# Patient Record
Sex: Female | Born: 1960
Health system: Southern US, Community
[De-identification: ages and names within clinical notes are randomized; demographics above are authoritative.]

## PROBLEM LIST (undated history)

## (undated) DIAGNOSIS — F32A Depression, unspecified: Secondary | ICD-10-CM

## (undated) DIAGNOSIS — F209 Schizophrenia, unspecified: Secondary | ICD-10-CM

## (undated) DIAGNOSIS — F419 Anxiety disorder, unspecified: Secondary | ICD-10-CM

## (undated) DIAGNOSIS — K219 Gastro-esophageal reflux disease without esophagitis: Secondary | ICD-10-CM

## (undated) DIAGNOSIS — E7800 Pure hypercholesterolemia, unspecified: Secondary | ICD-10-CM

## (undated) DIAGNOSIS — E119 Type 2 diabetes mellitus without complications: Secondary | ICD-10-CM

## (undated) HISTORY — DX: Type 2 diabetes mellitus without complications: E11.9

## (undated) HISTORY — DX: Depression, unspecified: F32.A

## (undated) HISTORY — DX: Pure hypercholesterolemia, unspecified: E78.00

## (undated) HISTORY — DX: Anxiety disorder, unspecified: F41.9

## (undated) HISTORY — DX: Gastro-esophageal reflux disease without esophagitis: K21.9

## (undated) HISTORY — DX: Schizophrenia, unspecified: F20.9

---

## 2006-06-30 ENCOUNTER — Inpatient Hospital Stay (HOSPITAL_COMMUNITY): Admission: AD | Admit: 2006-06-30 | Discharge: 2006-07-06 | Payer: Self-pay | Admitting: *Deleted

## 2006-06-30 ENCOUNTER — Ambulatory Visit: Payer: Self-pay | Admitting: *Deleted

## 2009-08-25 ENCOUNTER — Inpatient Hospital Stay: Payer: Self-pay | Admitting: Psychiatry

## 2012-06-21 DIAGNOSIS — E785 Hyperlipidemia, unspecified: Secondary | ICD-10-CM | POA: Insufficient documentation

## 2016-02-15 DIAGNOSIS — Z8249 Family history of ischemic heart disease and other diseases of the circulatory system: Secondary | ICD-10-CM | POA: Insufficient documentation

## 2020-10-25 DIAGNOSIS — E538 Deficiency of other specified B group vitamins: Secondary | ICD-10-CM | POA: Diagnosis not present

## 2020-11-26 DIAGNOSIS — E538 Deficiency of other specified B group vitamins: Secondary | ICD-10-CM | POA: Diagnosis not present

## 2020-12-28 DIAGNOSIS — E538 Deficiency of other specified B group vitamins: Secondary | ICD-10-CM | POA: Diagnosis not present

## 2021-01-10 DIAGNOSIS — F251 Schizoaffective disorder, depressive type: Secondary | ICD-10-CM | POA: Diagnosis not present

## 2021-01-12 DIAGNOSIS — E119 Type 2 diabetes mellitus without complications: Secondary | ICD-10-CM | POA: Diagnosis not present

## 2021-02-01 DIAGNOSIS — E538 Deficiency of other specified B group vitamins: Secondary | ICD-10-CM | POA: Diagnosis not present

## 2021-02-07 DIAGNOSIS — Z79899 Other long term (current) drug therapy: Secondary | ICD-10-CM | POA: Diagnosis not present

## 2021-02-07 DIAGNOSIS — F209 Schizophrenia, unspecified: Secondary | ICD-10-CM | POA: Diagnosis not present

## 2021-02-15 DIAGNOSIS — Z1211 Encounter for screening for malignant neoplasm of colon: Secondary | ICD-10-CM | POA: Diagnosis not present

## 2021-02-16 DIAGNOSIS — M8589 Other specified disorders of bone density and structure, multiple sites: Secondary | ICD-10-CM | POA: Diagnosis not present

## 2021-02-16 DIAGNOSIS — M810 Age-related osteoporosis without current pathological fracture: Secondary | ICD-10-CM | POA: Diagnosis not present

## 2021-03-03 DIAGNOSIS — D649 Anemia, unspecified: Secondary | ICD-10-CM | POA: Diagnosis not present

## 2021-03-03 DIAGNOSIS — R5383 Other fatigue: Secondary | ICD-10-CM | POA: Diagnosis not present

## 2021-03-03 DIAGNOSIS — E119 Type 2 diabetes mellitus without complications: Secondary | ICD-10-CM | POA: Diagnosis not present

## 2021-03-04 DIAGNOSIS — E538 Deficiency of other specified B group vitamins: Secondary | ICD-10-CM | POA: Diagnosis not present

## 2021-03-11 DIAGNOSIS — J0110 Acute frontal sinusitis, unspecified: Secondary | ICD-10-CM | POA: Diagnosis not present

## 2021-04-04 DIAGNOSIS — F251 Schizoaffective disorder, depressive type: Secondary | ICD-10-CM | POA: Diagnosis not present

## 2021-04-05 DIAGNOSIS — E538 Deficiency of other specified B group vitamins: Secondary | ICD-10-CM | POA: Diagnosis not present

## 2021-04-15 DIAGNOSIS — F411 Generalized anxiety disorder: Secondary | ICD-10-CM | POA: Diagnosis not present

## 2021-04-15 DIAGNOSIS — Z0000 Encounter for general adult medical examination without abnormal findings: Secondary | ICD-10-CM | POA: Diagnosis not present

## 2021-04-15 DIAGNOSIS — K219 Gastro-esophageal reflux disease without esophagitis: Secondary | ICD-10-CM | POA: Diagnosis not present

## 2021-04-15 DIAGNOSIS — Z6827 Body mass index (BMI) 27.0-27.9, adult: Secondary | ICD-10-CM | POA: Diagnosis not present

## 2021-04-15 DIAGNOSIS — Z1331 Encounter for screening for depression: Secondary | ICD-10-CM | POA: Diagnosis not present

## 2021-04-15 DIAGNOSIS — Z8249 Family history of ischemic heart disease and other diseases of the circulatory system: Secondary | ICD-10-CM | POA: Diagnosis not present

## 2021-04-15 DIAGNOSIS — F209 Schizophrenia, unspecified: Secondary | ICD-10-CM | POA: Diagnosis not present

## 2021-04-15 DIAGNOSIS — E7800 Pure hypercholesterolemia, unspecified: Secondary | ICD-10-CM | POA: Diagnosis not present

## 2021-04-15 DIAGNOSIS — F330 Major depressive disorder, recurrent, mild: Secondary | ICD-10-CM | POA: Diagnosis not present

## 2021-04-15 DIAGNOSIS — E538 Deficiency of other specified B group vitamins: Secondary | ICD-10-CM | POA: Diagnosis not present

## 2021-04-15 DIAGNOSIS — E119 Type 2 diabetes mellitus without complications: Secondary | ICD-10-CM | POA: Diagnosis not present

## 2021-04-25 DIAGNOSIS — F251 Schizoaffective disorder, depressive type: Secondary | ICD-10-CM | POA: Diagnosis not present

## 2021-04-26 DIAGNOSIS — Z1231 Encounter for screening mammogram for malignant neoplasm of breast: Secondary | ICD-10-CM | POA: Diagnosis not present

## 2021-04-27 DIAGNOSIS — M25572 Pain in left ankle and joints of left foot: Secondary | ICD-10-CM | POA: Diagnosis not present

## 2021-04-27 DIAGNOSIS — M25473 Effusion, unspecified ankle: Secondary | ICD-10-CM | POA: Diagnosis not present

## 2021-04-27 DIAGNOSIS — M7989 Other specified soft tissue disorders: Secondary | ICD-10-CM | POA: Diagnosis not present

## 2021-05-02 ENCOUNTER — Other Ambulatory Visit (HOSPITAL_COMMUNITY): Payer: Self-pay | Admitting: Internal Medicine

## 2021-05-02 DIAGNOSIS — Z8249 Family history of ischemic heart disease and other diseases of the circulatory system: Secondary | ICD-10-CM

## 2021-05-05 DIAGNOSIS — E538 Deficiency of other specified B group vitamins: Secondary | ICD-10-CM | POA: Diagnosis not present

## 2021-05-11 ENCOUNTER — Other Ambulatory Visit (HOSPITAL_COMMUNITY): Payer: Self-pay | Admitting: *Deleted

## 2021-05-11 ENCOUNTER — Telehealth (HOSPITAL_COMMUNITY): Payer: Self-pay | Admitting: *Deleted

## 2021-05-11 DIAGNOSIS — Z01812 Encounter for preprocedural laboratory examination: Secondary | ICD-10-CM | POA: Diagnosis not present

## 2021-05-11 NOTE — Telephone Encounter (Signed)
Reaching out to patient to offer assistance regarding upcoming cardiac imaging study; pt verbalizes understanding of appt date/time, parking situation and where to check in, pre-test NPO status, and verified current allergies; name and call back number provided for further questions should they arise  Stefany Starace RN Navigator Cardiac [facility] [phone] office [phone] cell  

## 2021-05-12 ENCOUNTER — Other Ambulatory Visit: Payer: Self-pay

## 2021-05-12 ENCOUNTER — Ambulatory Visit (HOSPITAL_COMMUNITY)
Admission: RE | Admit: 2021-05-12 | Discharge: 2021-05-12 | Disposition: A | Discharge status: Home / Self Care | Payer: Medicare Other | Source: Ambulatory Visit | Attending: Internal Medicine | Admitting: Internal Medicine

## 2021-05-12 DIAGNOSIS — J90 Pleural effusion, not elsewhere classified: Secondary | ICD-10-CM | POA: Insufficient documentation

## 2021-05-12 DIAGNOSIS — I281 Aneurysm of pulmonary artery: Secondary | ICD-10-CM | POA: Insufficient documentation

## 2021-05-12 DIAGNOSIS — Z8249 Family history of ischemic heart disease and other diseases of the circulatory system: Secondary | ICD-10-CM | POA: Diagnosis not present

## 2021-05-12 LAB — BASIC METABOLIC PANEL
BUN/Creatinine Ratio: 18 (ref 9–23)
BUN: 15 mg/dL (ref 6–24)
CO2: 23 mmol/L (ref 20–29)
Calcium: 9.6 mg/dL (ref 8.7–10.2)
Chloride: 103 mmol/L (ref 96–106)
Creatinine, Ser: 0.82 mg/dL (ref 0.57–1.00)
Glucose: 99 mg/dL (ref 65–99)
Potassium: 4.2 mmol/L (ref 3.5–5.2)
Sodium: 140 mmol/L (ref 134–144)
eGFR: 82 mL/min/{1.73_m2} (ref >59)

## 2021-05-12 MED ORDER — IOHEXOL 350 MG/ML SOLN
100.0000 mL | Freq: Once | INTRAVENOUS | Status: AC | PRN
  Administered 2021-05-12: 100 mL via INTRAVENOUS

## 2021-05-12 MED ORDER — NITROGLYCERIN 0.4 MG SL SUBL
0.8000 mg | SUBLINGUAL_TABLET | Freq: Once | SUBLINGUAL | Status: AC
  Administered 2021-05-12: 0.8 mg via SUBLINGUAL

## 2021-05-12 MED ORDER — NITROGLYCERIN 0.4 MG SL SUBL
SUBLINGUAL_TABLET | SUBLINGUAL | Status: AC
  Filled 2021-05-12: qty 2

## 2021-05-12 NOTE — Progress Notes (Signed)
CT scan completed. Tolerated well. D/C home ambulatory. Awake and alert. In no distress 

## 2021-05-17 DIAGNOSIS — F411 Generalized anxiety disorder: Secondary | ICD-10-CM | POA: Diagnosis not present

## 2021-05-17 DIAGNOSIS — E538 Deficiency of other specified B group vitamins: Secondary | ICD-10-CM | POA: Diagnosis not present

## 2021-05-17 DIAGNOSIS — E119 Type 2 diabetes mellitus without complications: Secondary | ICD-10-CM | POA: Diagnosis not present

## 2021-05-17 DIAGNOSIS — Z0000 Encounter for general adult medical examination without abnormal findings: Secondary | ICD-10-CM | POA: Diagnosis not present

## 2021-05-17 DIAGNOSIS — E7800 Pure hypercholesterolemia, unspecified: Secondary | ICD-10-CM | POA: Diagnosis not present

## 2021-05-17 DIAGNOSIS — K219 Gastro-esophageal reflux disease without esophagitis: Secondary | ICD-10-CM | POA: Diagnosis not present

## 2021-06-08 DIAGNOSIS — E538 Deficiency of other specified B group vitamins: Secondary | ICD-10-CM | POA: Diagnosis not present

## 2021-06-08 DIAGNOSIS — F251 Schizoaffective disorder, depressive type: Secondary | ICD-10-CM | POA: Diagnosis not present

## 2021-07-14 DIAGNOSIS — E538 Deficiency of other specified B group vitamins: Secondary | ICD-10-CM | POA: Diagnosis not present

## 2021-07-14 DIAGNOSIS — R922 Inconclusive mammogram: Secondary | ICD-10-CM | POA: Diagnosis not present

## 2021-07-14 DIAGNOSIS — R928 Other abnormal and inconclusive findings on diagnostic imaging of breast: Secondary | ICD-10-CM | POA: Diagnosis not present

## 2021-07-20 DIAGNOSIS — B351 Tinea unguium: Secondary | ICD-10-CM | POA: Diagnosis not present

## 2021-07-20 DIAGNOSIS — E119 Type 2 diabetes mellitus without complications: Secondary | ICD-10-CM | POA: Diagnosis not present

## 2021-08-16 DIAGNOSIS — E538 Deficiency of other specified B group vitamins: Secondary | ICD-10-CM | POA: Diagnosis not present

## 2021-09-05 DIAGNOSIS — F251 Schizoaffective disorder, depressive type: Secondary | ICD-10-CM | POA: Diagnosis not present

## 2021-09-20 DIAGNOSIS — E538 Deficiency of other specified B group vitamins: Secondary | ICD-10-CM | POA: Diagnosis not present

## 2021-10-24 DIAGNOSIS — E538 Deficiency of other specified B group vitamins: Secondary | ICD-10-CM | POA: Diagnosis not present

## 2021-11-25 DIAGNOSIS — E538 Deficiency of other specified B group vitamins: Secondary | ICD-10-CM | POA: Diagnosis not present

## 2021-12-05 DIAGNOSIS — F251 Schizoaffective disorder, depressive type: Secondary | ICD-10-CM | POA: Diagnosis not present

## 2021-12-26 DIAGNOSIS — B351 Tinea unguium: Secondary | ICD-10-CM | POA: Diagnosis not present

## 2021-12-26 DIAGNOSIS — E119 Type 2 diabetes mellitus without complications: Secondary | ICD-10-CM | POA: Diagnosis not present

## 2021-12-26 DIAGNOSIS — E538 Deficiency of other specified B group vitamins: Secondary | ICD-10-CM | POA: Diagnosis not present

## 2022-01-27 DIAGNOSIS — E538 Deficiency of other specified B group vitamins: Secondary | ICD-10-CM | POA: Diagnosis not present

## 2022-02-22 DIAGNOSIS — Z1211 Encounter for screening for malignant neoplasm of colon: Secondary | ICD-10-CM | POA: Diagnosis not present

## 2022-02-22 DIAGNOSIS — K5730 Diverticulosis of large intestine without perforation or abscess without bleeding: Secondary | ICD-10-CM | POA: Diagnosis not present

## 2022-02-22 DIAGNOSIS — Z86010 Personal history of colonic polyps: Secondary | ICD-10-CM | POA: Diagnosis not present

## 2022-02-27 DIAGNOSIS — E538 Deficiency of other specified B group vitamins: Secondary | ICD-10-CM | POA: Diagnosis not present

## 2022-02-27 DIAGNOSIS — F251 Schizoaffective disorder, depressive type: Secondary | ICD-10-CM | POA: Diagnosis not present

## 2022-03-31 DIAGNOSIS — E538 Deficiency of other specified B group vitamins: Secondary | ICD-10-CM | POA: Diagnosis not present

## 2022-04-28 DIAGNOSIS — F209 Schizophrenia, unspecified: Secondary | ICD-10-CM | POA: Diagnosis not present

## 2022-04-28 DIAGNOSIS — E538 Deficiency of other specified B group vitamins: Secondary | ICD-10-CM | POA: Diagnosis not present

## 2022-04-28 DIAGNOSIS — B351 Tinea unguium: Secondary | ICD-10-CM | POA: Diagnosis not present

## 2022-04-28 DIAGNOSIS — E119 Type 2 diabetes mellitus without complications: Secondary | ICD-10-CM | POA: Diagnosis not present

## 2022-04-28 DIAGNOSIS — Z0000 Encounter for general adult medical examination without abnormal findings: Secondary | ICD-10-CM | POA: Diagnosis not present

## 2022-04-28 DIAGNOSIS — Z6825 Body mass index (BMI) 25.0-25.9, adult: Secondary | ICD-10-CM | POA: Diagnosis not present

## 2022-04-28 DIAGNOSIS — E7800 Pure hypercholesterolemia, unspecified: Secondary | ICD-10-CM | POA: Diagnosis not present

## 2022-04-28 DIAGNOSIS — F330 Major depressive disorder, recurrent, mild: Secondary | ICD-10-CM | POA: Diagnosis not present

## 2022-04-28 DIAGNOSIS — F411 Generalized anxiety disorder: Secondary | ICD-10-CM | POA: Diagnosis not present

## 2022-04-28 DIAGNOSIS — K219 Gastro-esophageal reflux disease without esophagitis: Secondary | ICD-10-CM | POA: Diagnosis not present

## 2022-05-02 DIAGNOSIS — E119 Type 2 diabetes mellitus without complications: Secondary | ICD-10-CM | POA: Diagnosis not present

## 2022-05-02 DIAGNOSIS — E7800 Pure hypercholesterolemia, unspecified: Secondary | ICD-10-CM | POA: Diagnosis not present

## 2022-05-02 DIAGNOSIS — F209 Schizophrenia, unspecified: Secondary | ICD-10-CM | POA: Diagnosis not present

## 2022-05-02 DIAGNOSIS — B351 Tinea unguium: Secondary | ICD-10-CM | POA: Diagnosis not present

## 2022-05-02 DIAGNOSIS — Z0000 Encounter for general adult medical examination without abnormal findings: Secondary | ICD-10-CM | POA: Diagnosis not present

## 2022-05-02 DIAGNOSIS — Z6825 Body mass index (BMI) 25.0-25.9, adult: Secondary | ICD-10-CM | POA: Diagnosis not present

## 2022-05-02 DIAGNOSIS — E538 Deficiency of other specified B group vitamins: Secondary | ICD-10-CM | POA: Diagnosis not present

## 2022-05-02 DIAGNOSIS — K219 Gastro-esophageal reflux disease without esophagitis: Secondary | ICD-10-CM | POA: Diagnosis not present

## 2022-05-08 DIAGNOSIS — F251 Schizoaffective disorder, depressive type: Secondary | ICD-10-CM | POA: Diagnosis not present

## 2022-06-06 DIAGNOSIS — E538 Deficiency of other specified B group vitamins: Secondary | ICD-10-CM | POA: Diagnosis not present

## 2022-07-11 DIAGNOSIS — E538 Deficiency of other specified B group vitamins: Secondary | ICD-10-CM | POA: Diagnosis not present

## 2022-08-01 DIAGNOSIS — E119 Type 2 diabetes mellitus without complications: Secondary | ICD-10-CM | POA: Diagnosis not present

## 2022-08-09 DIAGNOSIS — F251 Schizoaffective disorder, depressive type: Secondary | ICD-10-CM | POA: Diagnosis not present

## 2022-08-15 DIAGNOSIS — E538 Deficiency of other specified B group vitamins: Secondary | ICD-10-CM | POA: Diagnosis not present

## 2022-09-15 ENCOUNTER — Ambulatory Visit: Payer: Medicare Other

## 2022-09-18 ENCOUNTER — Ambulatory Visit (INDEPENDENT_AMBULATORY_CARE_PROVIDER_SITE_OTHER): Payer: Medicare Other

## 2022-09-18 DIAGNOSIS — E538 Deficiency of other specified B group vitamins: Secondary | ICD-10-CM

## 2022-09-18 MED ORDER — CYANOCOBALAMIN 1000 MCG/ML IJ SOLN
1000.0000 ug | Freq: Once | INTRAMUSCULAR | Status: AC
  Administered 2022-09-18: 1000 ug via INTRAMUSCULAR

## 2022-09-19 ENCOUNTER — Ambulatory Visit: Payer: Medicare Other

## 2022-09-24 IMAGING — CT CT HEART MORP W/ CTA COR W/ SCORE W/ CA W/CM &/OR W/O CM
1 series · 5 of 8 positions shown, 7 images · non-contrast
Comparison: None.
COMPARISON: None.

Addendum:
EXAM:
OVER-READ INTERPRETATION  CT CHEST

The following report is an over-read performed by radiologist Dr.
Boo Winger [REDACTED] on 05/12/2021. This
over-read does not include interpretation of cardiac or coronary
anatomy or pathology. The coronary calcium score/coronary CTA
interpretation by the cardiologist is attached.
CLINICAL DATA: 59 Year-old Female
Cardiac/Coronary  CTA
TECHNIQUE: The patient was scanned on a [employer] Force scanner.

[Series 991: — · 5 of 8 slices shown, 7 images]
[im 2/8  vessel]
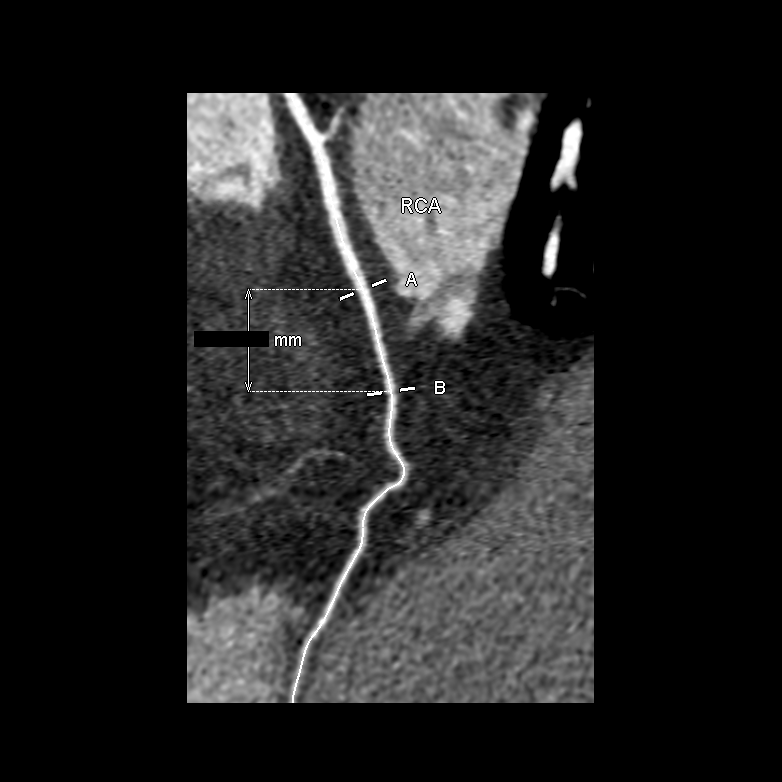
[im 2/8  lung]
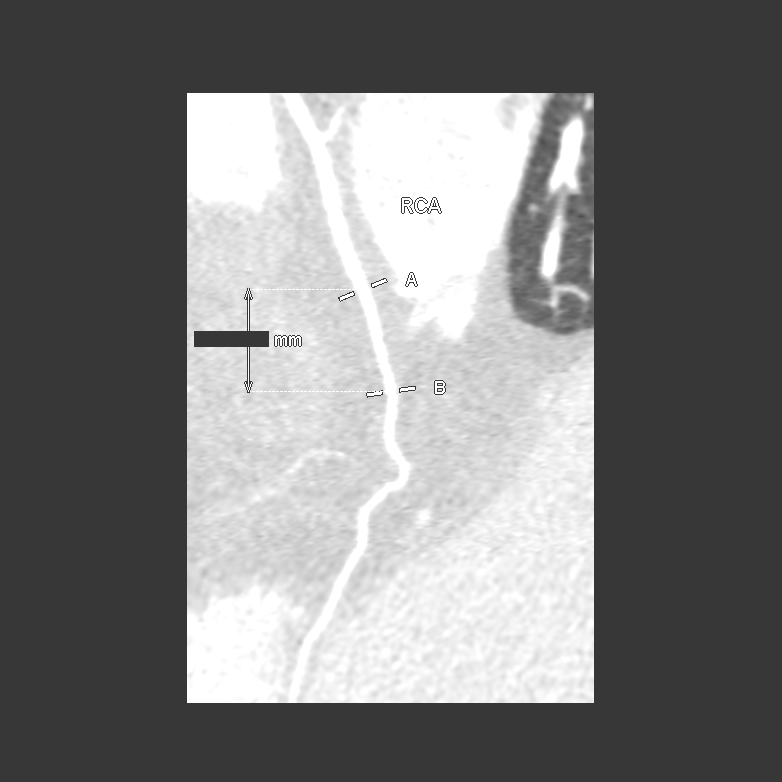
[im 3/8  vessel]
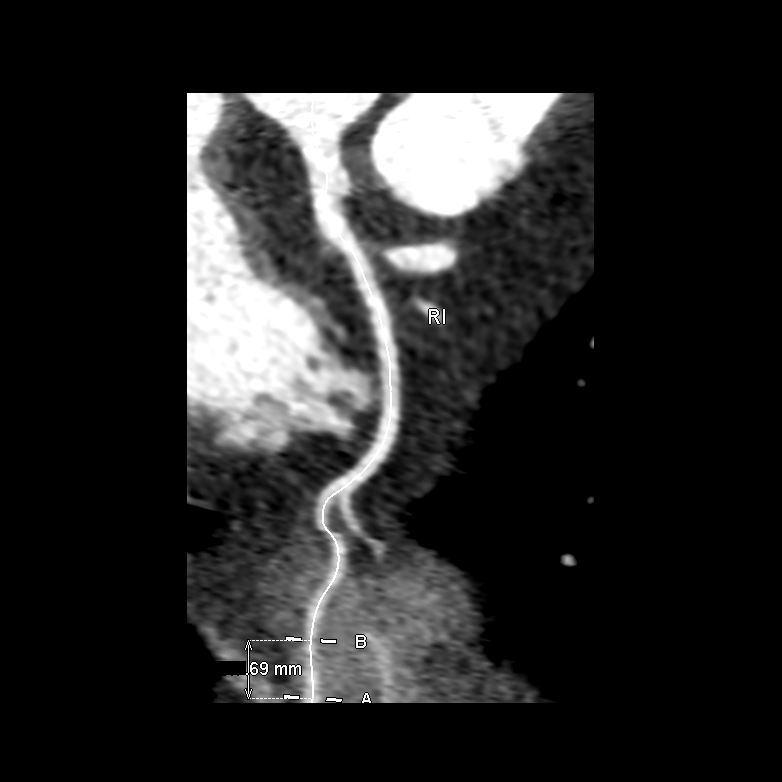
[im 5/8  vessel]
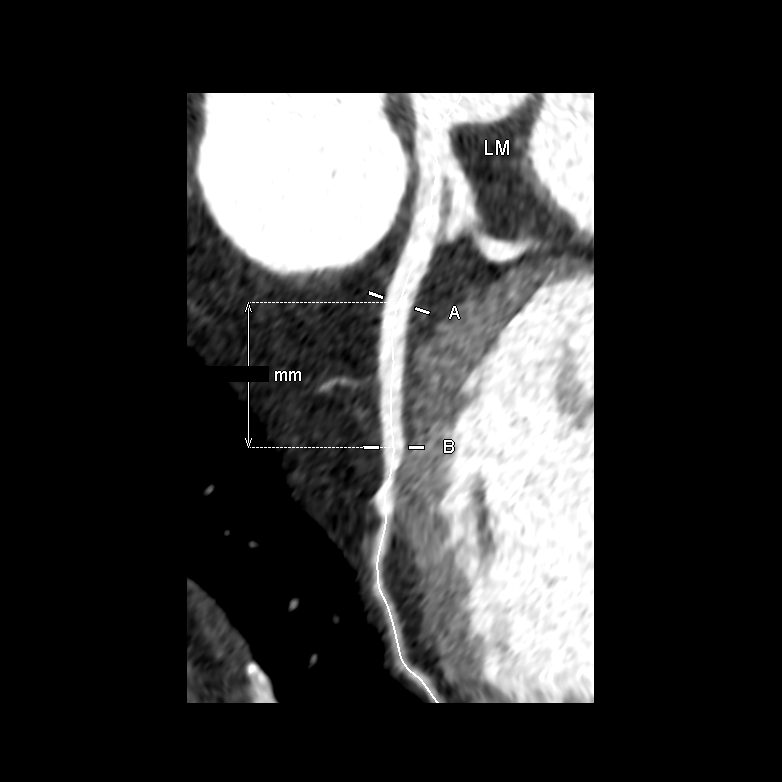
[im 6/8  vessel]
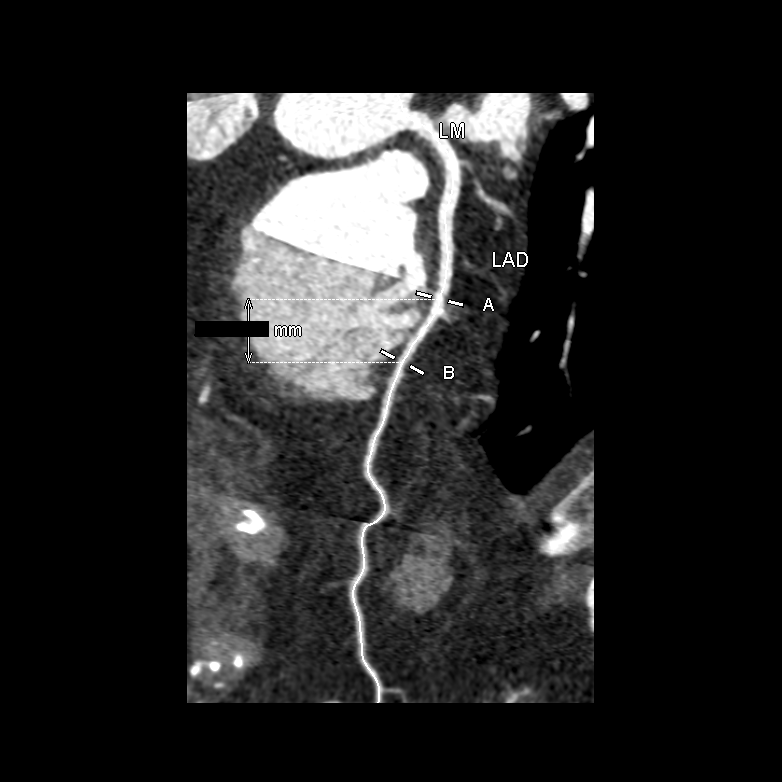
[im 7/8  vessel]
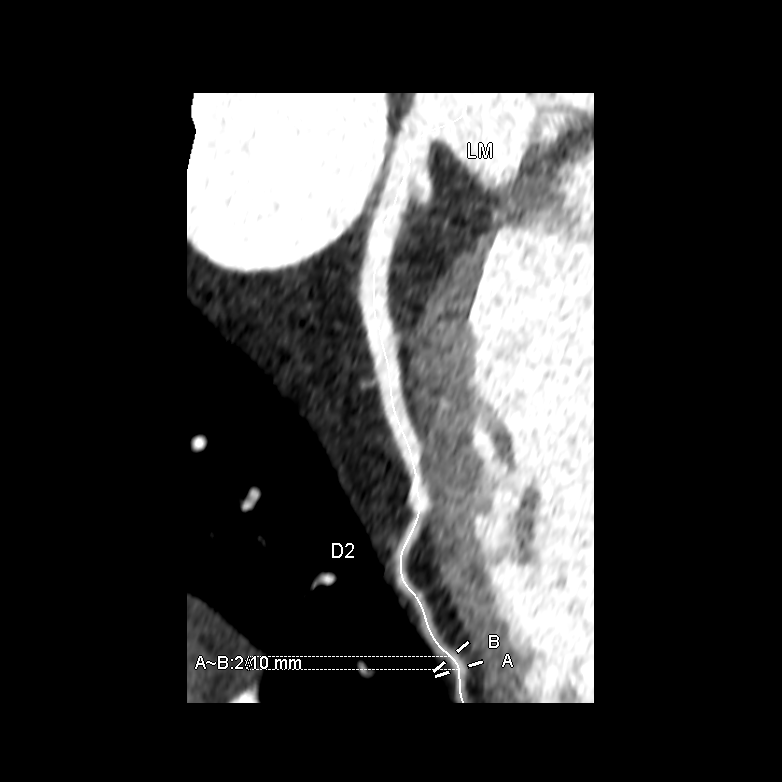
[im 7/8  lung]
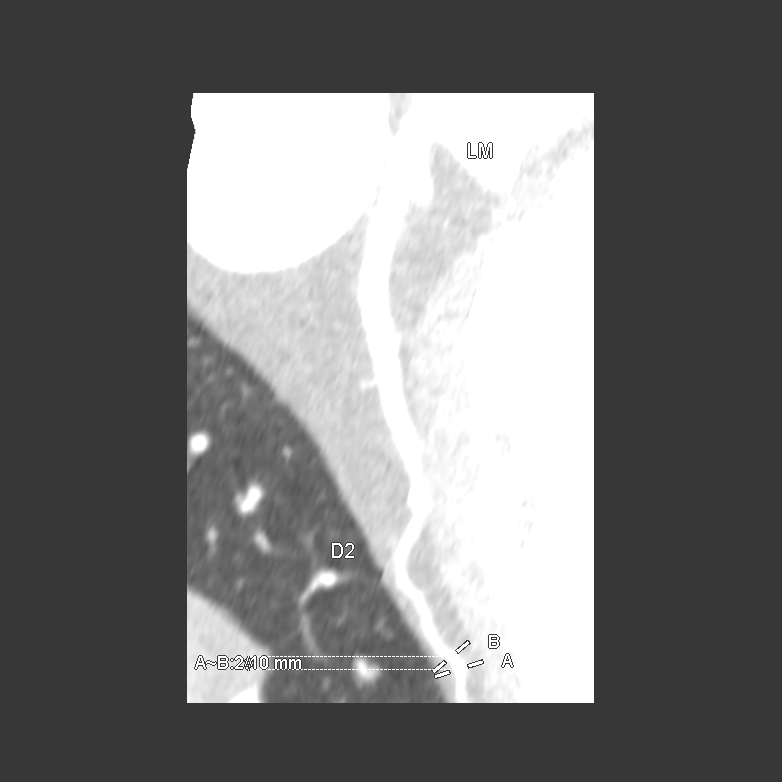

[5 of 8 positions shown; findings below may reference images not displayed]

FINDINGS: Vascular: Heart is enlarged.  No pericardial effusion.

Mediastinum/Nodes: None.

Lungs/Pleura: Scarring in the lingula. Minimal dependent
atelectasis. Trace bilateral pleural effusions.

Upper Abdomen: None.

Musculoskeletal: Degenerative changes in the spine.
IMPRESSION: Trace bilateral pleural effusions.
FINDINGS: A 100 kV prospective scan was triggered in the descending thoracic
aorta at 111 HU's. Axial non-contrast 3 mm slices were carried out
through the heart. The data set was analyzed on a dedicated work
station and scored using the Agatson method. Gantry rotation speed
was 250 msecs and collimation was .6 mm. No beta blockade and 0.8 mg
of sl NTG was given. The 3D data set was reconstructed in 5%
intervals of the 67-82 % of the R-R cycle. Diastolic phases were
analyzed on a dedicated work station using MPR, MIP and VRT modes.
The patient received 100 cc of contrast.

Aorta:  Normal size.  No calcifications.  No dissection.

Main Pulmonary Artery: Dilation of the main pulmonary artery 42 mm.

Aortic Valve:  Tri-leaflet.  No calcifications.

Coronary Arteries:  Normal coronary origin.  Co-dominance.

Coronary Calcium Score:

Left main: 0

Left anterior descending artery: 0

Left circumflex artery: 0

Right coronary artery: 0

Total: 0

Percentile: 1st for age, sex, and race matched control.

RCA is a large artery that gives rise to PDA and PLA. There is no
significant plaque.

Left main is a large artery that gives rise to LAD and LCX arteries.
There is no significant plaque.

LAD is a large vessel that gives rise to two diagonal branches.
There is no significant plaque.

LCX is a large artery that gives rise to one large OM1 branch. There
is no significant plaque.

There is a ramus intermedius vessel that bifurcates. There is no
significant plaque.

Other findings:

Normal pulmonary vein drainage into the left atrium.

Normal left atrial appendage without a thrombus.

Extra-cardiac findings: See attached radiology report for
non-cardiac structures.
IMPRESSION: 1. Coronary calcium score of 0. This was 1st percentile for age,
sex, and race matched control.

2. Normal coronary origin with co-dominance.

3. Dilation of the main pulmonary artery 42 mm. This can be
associated with the presence of pulmonary hypertension; clinical
correlation advised.

4. CAD-RADS 0. No evidence of CAD. Consider non-atherosclerotic
causes of chest pain.

RECOMMENDATIONS:



If CAC = 0, it is reasonable to withhold statin therapy and reassess
in 5 to 10 years, as long as higher risk conditions are absent
(diabetes mellitus, family history of premature CHD in first degree
relatives (males <55 years; females <65 years), cigarette smoking,
LDL >=190 mg/dL or other independent risk factors).

If CAC is 1 to 99, it is reasonable to initiate statin therapy for
patients >=55 years of age.

If CAC is >=100 or >=75th percentile, it is reasonable to initiate
statin therapy at any age.

Cardiology referral should be considered for patients with CAC
scores =400 or >=75th percentile.

*6928 AHA/ACC/AACVPR/AAPA/ABC/LUNDQUIST/SCULLARD/RRAGO/Ourari/JUMPER/SEL/CELINA
Guideline on the Management of Blood Cholesterol: A Report of the
American College of Cardiology/American Heart Association Task Force
on Clinical Practice Guidelines. J Am Coll Cardiol.
2984;73(24):2651-2180.

*** End of Addendum ***
EXAM:
OVER-READ INTERPRETATION  CT CHEST

The following report is an over-read performed by radiologist Dr.
Boo Winger [REDACTED] on 05/12/2021. This
over-read does not include interpretation of cardiac or coronary
anatomy or pathology. The coronary calcium score/coronary CTA
interpretation by the cardiologist is attached.
FINDINGS: Vascular: Heart is enlarged.  No pericardial effusion.

Mediastinum/Nodes: None.

Lungs/Pleura: Scarring in the lingula. Minimal dependent
atelectasis. Trace bilateral pleural effusions.

Upper Abdomen: None.

Musculoskeletal: Degenerative changes in the spine.
IMPRESSION: Trace bilateral pleural effusions.

## 2022-10-05 ENCOUNTER — Other Ambulatory Visit: Payer: Self-pay

## 2022-10-05 MED ORDER — OMEPRAZOLE 20 MG PO CPDR: 20.0000 mg | DELAYED_RELEASE_CAPSULE | Freq: Every day | ORAL | 1 refills | Status: DC

## 2022-10-24 ENCOUNTER — Ambulatory Visit: Payer: Medicare Other

## 2022-10-25 ENCOUNTER — Ambulatory Visit: Payer: Medicare HMO

## 2022-10-25 DIAGNOSIS — E538 Deficiency of other specified B group vitamins: Secondary | ICD-10-CM | POA: Diagnosis not present

## 2022-10-25 MED ORDER — CYANOCOBALAMIN 1000 MCG/ML IJ SOLN
1000.0000 ug | Freq: Once | INTRAMUSCULAR | Status: AC
  Administered 2022-10-25: 1000 ug via INTRAMUSCULAR

## 2022-10-25 NOTE — Progress Notes (Signed)
Vitamin B12 Injection

## 2022-11-06 ENCOUNTER — Ambulatory Visit: Payer: Medicare HMO | Admitting: Internal Medicine

## 2022-11-06 ENCOUNTER — Encounter: Payer: Self-pay | Admitting: Internal Medicine

## 2022-11-06 VITALS — BP 108/62 | HR 70 | Temp 97.6°F | Resp 16 | Ht 62.0 in | Wt 137.4 lb

## 2022-11-06 DIAGNOSIS — E119 Type 2 diabetes mellitus without complications: Secondary | ICD-10-CM | POA: Diagnosis not present

## 2022-11-06 NOTE — Progress Notes (Signed)
Office Visit  Subjective   Patient ID: Kayla Guzman   DOB: [birth year]   Age: 62 y.o.   MRN: [id number]   Chief Complaint Chief Complaint  Patient presents with   Follow-up    diabetes     History of Present Illness The patient is a 62 year old Caucasian/White female who returns for a follow-up visit for her T2 diabetes. She was diagnosed with T2 diabetes in 2015. Since the last visit, there have been no problems.  She did try starting on a treadmill for exercising but she did not continue this regularly.  She remains on Januvia 100 mg daily and metformin 1000mg  BID. She is not walking as much as they would like. She specifically denies unexplained abdominal pain, nausea or vomiting, or documented hypoglycemia. She states she has not been checking her blood sugars recently.  She came in fasting today in anticipation of lab work. Her last HgbA1c was done 6 months ago and was 5.8%. She denies any long term complications of diabetic neuropathy, nephropathy, retinopathy or cardiovascular disease.  She does not remember her last eye exam appointment.         Past Medical History Past Medical History:  Diagnosis Date   Anxiety    Depression    Diabetes mellitus, type 2 (HCC)    GERD (gastroesophageal reflux disease)    Hypercholesteremia    Schizophrenia (HCC)      Allergies Allergies  Allergen Reactions   Sambucus Nigra    Ziprasidone Rash     Medications  Current Outpatient Medications:    escitalopram (LEXAPRO) 10 MG tablet, Take 10 mg by mouth daily., Disp: , Rfl:    JANUVIA 100 MG tablet, Take 100 mg by mouth daily., Disp: , Rfl:    benztropine (COGENTIN) 0.5 MG tablet, Take 0.5 mg by mouth 2 (two) times daily., Disp: , Rfl:    metFORMIN (GLUCOPHAGE) 1000 MG tablet, Take 1,000 mg by mouth 2 (two) times daily., Disp: , Rfl:    omeprazole (PRILOSEC) 20 MG capsule, Take 1 capsule (20 mg total) by mouth daily., Disp: 90 capsule, Rfl: 1   risperiDONE (RISPERDAL) 3 MG tablet,  Take 3 mg by mouth at bedtime., Disp: , Rfl:    rosuvastatin (CRESTOR) 40 MG tablet, Take 40 mg by mouth daily., Disp: , Rfl:    Review of Systems Review of Systems  Constitutional:  Negative for chills and fever.  Eyes:  Negative for blurred vision and double vision.  Respiratory:  Negative for cough and shortness of breath.   Cardiovascular:  Negative for chest pain, palpitations and leg swelling.  Gastrointestinal:  Negative for abdominal pain, constipation, diarrhea, heartburn, nausea and vomiting.  Genitourinary:  Negative for dysuria and frequency.  Musculoskeletal:  Negative for myalgias.  Skin:  Negative for itching and rash.  Neurological:  Negative for dizziness, weakness and headaches.       Objective:    Vitals BP 108/62   Pulse 70   Temp 97.6 F (36.4 C)   Resp 16   Ht 5\' 2"  (1.575 m)   Wt 137 lb 6.4 oz (62.3 kg)   SpO2 99%   BMI 25.13 kg/m    Physical Examination Physical Exam Constitutional:      Appearance: Normal appearance. She is not ill-appearing.  Cardiovascular:     Rate and Rhythm: Normal rate and regular rhythm.     Pulses: Normal pulses.     Heart sounds: No murmur heard.    No friction  rub. No gallop.  Pulmonary:     Effort: Pulmonary effort is normal. No respiratory distress.     Breath sounds: No wheezing, rhonchi or rales.  Abdominal:     General: Abdomen is flat. Bowel sounds are normal. There is no distension.     Palpations: Abdomen is soft.     Tenderness: There is no abdominal tenderness.  Musculoskeletal:     Right lower leg: No edema.     Left lower leg: No edema.  Skin:    General: Skin is warm and dry.     Findings: No rash.  Neurological:     Mental Status: She is alert.        Assessment & Plan:   T2 Diabetes mellitus without complication ([place]) The patient will start trying to exercise more.  We will check a HgBA1c with goal <7%.  We will refer her to [facility] for her yearly eye exam.    Return in about 3  months (around 02/05/2023).   Townsend Roger, MD

## 2022-11-06 NOTE — Assessment & Plan Note (Addendum)
The patient will start trying to exercise more.  We will check a HgBA1c with goal <7%.  We will refer her to [facility] for her yearly eye exam.

## 2022-11-07 LAB — HEMOGLOBIN A1C
Est. average glucose Bld gHb Est-mCnc: 123 mg/dL
Hgb A1c MFr Bld: 5.9 % — ABNORMAL HIGH (ref 4.8–5.6)

## 2022-11-08 DIAGNOSIS — F419 Anxiety disorder, unspecified: Secondary | ICD-10-CM | POA: Diagnosis not present

## 2022-11-13 ENCOUNTER — Telehealth: Payer: Self-pay

## 2022-11-13 NOTE — Telephone Encounter (Signed)
Pt notified of labs

## 2022-11-13 NOTE — Telephone Encounter (Signed)
-----  Message from Townsend Roger, MD sent at 11/12/2022  7:25 PM EST ----- Her A1c looks good.

## 2022-11-28 ENCOUNTER — Ambulatory Visit: Payer: Medicare HMO

## 2022-11-28 DIAGNOSIS — F419 Anxiety disorder, unspecified: Secondary | ICD-10-CM | POA: Diagnosis not present

## 2022-11-28 DIAGNOSIS — E538 Deficiency of other specified B group vitamins: Secondary | ICD-10-CM | POA: Diagnosis not present

## 2022-11-28 MED ORDER — CYANOCOBALAMIN 1000 MCG/ML IJ SOLN
1000.0000 ug | Freq: Once | INTRAMUSCULAR | Status: AC
  Administered 2022-11-28: 1000 ug via INTRAMUSCULAR

## 2022-11-29 ENCOUNTER — Other Ambulatory Visit: Payer: Self-pay | Admitting: Internal Medicine

## 2022-12-05 DIAGNOSIS — F251 Schizoaffective disorder, depressive type: Secondary | ICD-10-CM | POA: Diagnosis not present

## 2022-12-26 DIAGNOSIS — F251 Schizoaffective disorder, depressive type: Secondary | ICD-10-CM | POA: Diagnosis not present

## 2022-12-28 ENCOUNTER — Ambulatory Visit: Payer: Medicare HMO

## 2023-01-02 ENCOUNTER — Other Ambulatory Visit: Payer: Self-pay | Admitting: Internal Medicine

## 2023-01-25 DIAGNOSIS — Z0100 Encounter for examination of eyes and vision without abnormal findings: Secondary | ICD-10-CM | POA: Diagnosis not present

## 2023-01-30 ENCOUNTER — Ambulatory Visit: Payer: Medicare HMO

## 2023-01-30 DIAGNOSIS — E538 Deficiency of other specified B group vitamins: Secondary | ICD-10-CM

## 2023-01-30 MED ORDER — CYANOCOBALAMIN 1000 MCG/ML IJ SOLN
1000.0000 ug | Freq: Once | INTRAMUSCULAR | Status: AC
  Administered 2023-01-30: 1000 ug via INTRAMUSCULAR

## 2023-01-31 DIAGNOSIS — H2513 Age-related nuclear cataract, bilateral: Secondary | ICD-10-CM | POA: Diagnosis not present

## 2023-01-31 DIAGNOSIS — E119 Type 2 diabetes mellitus without complications: Secondary | ICD-10-CM | POA: Diagnosis not present

## 2023-02-07 DIAGNOSIS — F251 Schizoaffective disorder, depressive type: Secondary | ICD-10-CM | POA: Diagnosis not present

## 2023-02-14 ENCOUNTER — Ambulatory Visit: Payer: Medicare HMO | Admitting: Internal Medicine

## 2023-02-14 ENCOUNTER — Encounter: Payer: Self-pay | Admitting: Internal Medicine

## 2023-02-14 VITALS — BP 114/62 | HR 70 | Temp 98.1°F | Resp 18 | Ht 62.0 in | Wt 142.4 lb

## 2023-02-14 DIAGNOSIS — E119 Type 2 diabetes mellitus without complications: Secondary | ICD-10-CM

## 2023-02-14 NOTE — Progress Notes (Signed)
Office Visit  Subjective   Patient ID: Kayla Guzman   DOB: [birth year]   Age: 62 y.o.   MRN: [id number]   Chief Complaint Chief Complaint  Patient presents with   Follow-up     History of Present Illness The patient is a 62 year old Caucasian/White female who returns for a follow-up visit for her T2 diabetes. She was diagnosed with T2 diabetes in 2015. Since the last visit, there have been no problems.  She did try starting on a treadmill for exercising but she did not continue this regularly.  She remains on Januvia 100 mg daily and metformin 1000mg  BID. She is not walking as much as they would like. She specifically denies unexplained abdominal pain, nausea or vomiting, or documented hypoglycemia. She states she has not been checking her blood sugars recently.  She came in fasting today in anticipation of lab work. Her last HgbA1c was done 3 months ago and was 5.9%. She denies any long term complications of diabetic neuropathy, nephropathy, retinopathy or cardiovascular disease.  Her last dilated eye exam was done on 01/31/2023 and that showed no evidence of diabetic retinopathy.     Past Medical History Past Medical History:  Diagnosis Date   Anxiety    Depression    Diabetes mellitus, type 2 (HCC)    GERD (gastroesophageal reflux disease)    Hypercholesteremia    Schizophrenia (HCC)      Allergies Allergies  Allergen Reactions   Sambucus Nigra    Ziprasidone Rash     Medications  Current Outpatient Medications:    benztropine (COGENTIN) 1 MG tablet, Take 1 mg by mouth 2 (two) times daily as needed., Disp: , Rfl:    escitalopram (LEXAPRO) 10 MG tablet, Take 10 mg by mouth daily., Disp: , Rfl:    metFORMIN (GLUCOPHAGE) 1000 MG tablet, TAKE 1 TABLET(1000 MG) BY MOUTH TWICE DAILY WITH THE MORNING AND EVENING MEAL, Disp: 180 tablet, Rfl: 1   omeprazole (PRILOSEC) 20 MG capsule, Take 1 capsule (20 mg total) by mouth daily., Disp: 90 capsule, Rfl: 1   risperiDONE (RISPERDAL) 3  MG tablet, Take 3 mg by mouth at bedtime., Disp: , Rfl:    rosuvastatin (CRESTOR) 40 MG tablet, TAKE 1 TABLET(40 MG) BY MOUTH EVERY DAY, Disp: 90 tablet, Rfl: 0   sitaGLIPtin (JANUVIA) 100 MG tablet, TAKE 1 TABLET(100 MG) BY MOUTH EVERY DAY, Disp: 90 tablet, Rfl: 1   Review of Systems Review of Systems  Constitutional:  Negative for chills and fever.  Eyes:  Negative for blurred vision and double vision.  Respiratory:  Negative for cough and shortness of breath.   Cardiovascular:  Negative for chest pain, palpitations and leg swelling.  Gastrointestinal:  Negative for abdominal pain, constipation, diarrhea, nausea and vomiting.  Neurological:  Negative for dizziness, weakness and headaches.       Objective:    Vitals BP 114/62 (BP Location: Right Arm, Patient Position: Sitting, Cuff Size: Normal)   Pulse 70   Temp 98.1 F (36.7 C) (Temporal)   Resp 18   Ht 5\' 2"  (1.575 m)   Wt 142 lb 6.4 oz (64.6 kg)   SpO2 99%   BMI 26.05 kg/m    Physical Examination Physical Exam Constitutional:      Appearance: Normal appearance. She is not ill-appearing.  Cardiovascular:     Rate and Rhythm: Normal rate and regular rhythm.     Pulses: Normal pulses.     Heart sounds: No murmur heard.  No friction rub. No gallop.  Pulmonary:     Effort: Pulmonary effort is normal. No respiratory distress.     Breath sounds: No wheezing, rhonchi or rales.  Abdominal:     General: Bowel sounds are normal. There is no distension.     Palpations: Abdomen is soft.     Tenderness: There is no abdominal tenderness.  Musculoskeletal:     Right lower leg: No edema.     Left lower leg: No edema.  Skin:    General: Skin is warm and dry.     Findings: No rash.  Neurological:     Mental Status: She is alert.        Assessment & Plan:   T2 Diabetes mellitus without complication (HCC) We will check her HgBA1c on her next visit.  Her diabetes has been controlled.  Continue with her current  medications.  I did fill out DMV paperwork.    Return in about 3 months (around 05/17/2023) for annual.   Crist Fat, MD

## 2023-02-14 NOTE — Assessment & Plan Note (Signed)
We will check her HgBA1c on her next visit.  Her diabetes has been controlled.  Continue with her current medications.  I did fill out DMV paperwork.

## 2023-03-02 ENCOUNTER — Ambulatory Visit: Payer: Medicare HMO

## 2023-03-06 ENCOUNTER — Ambulatory Visit: Payer: Medicare HMO

## 2023-03-06 DIAGNOSIS — E538 Deficiency of other specified B group vitamins: Secondary | ICD-10-CM

## 2023-03-06 MED ORDER — CYANOCOBALAMIN 1000 MCG/ML IJ SOLN
1000.0000 ug | Freq: Once | INTRAMUSCULAR | Status: AC
  Administered 2023-03-06: 1000 ug via INTRAMUSCULAR

## 2023-03-26 ENCOUNTER — Other Ambulatory Visit: Payer: Self-pay | Admitting: Internal Medicine

## 2023-04-10 ENCOUNTER — Ambulatory Visit: Payer: Medicare HMO

## 2023-04-12 ENCOUNTER — Ambulatory Visit: Payer: Medicare HMO

## 2023-04-18 ENCOUNTER — Other Ambulatory Visit: Payer: Self-pay | Admitting: Internal Medicine

## 2023-04-18 DIAGNOSIS — F251 Schizoaffective disorder, depressive type: Secondary | ICD-10-CM | POA: Diagnosis not present

## 2023-04-25 ENCOUNTER — Ambulatory Visit: Payer: Medicare HMO | Admitting: Internal Medicine

## 2023-04-25 ENCOUNTER — Ambulatory Visit: Payer: Medicare HMO

## 2023-04-25 DIAGNOSIS — E538 Deficiency of other specified B group vitamins: Secondary | ICD-10-CM | POA: Diagnosis not present

## 2023-04-25 MED ORDER — CYANOCOBALAMIN 1000 MCG/ML IJ SOLN
1000.0000 ug | Freq: Once | INTRAMUSCULAR | Status: AC
  Administered 2023-04-25: 1000 ug via INTRAMUSCULAR

## 2023-04-25 NOTE — Progress Notes (Signed)
Vitamin B12 Injection 

## 2023-05-20 ENCOUNTER — Other Ambulatory Visit: Payer: Self-pay | Admitting: Internal Medicine

## 2023-05-22 ENCOUNTER — Other Ambulatory Visit: Payer: Self-pay | Admitting: Internal Medicine

## 2023-05-28 ENCOUNTER — Ambulatory Visit: Payer: Medicare HMO

## 2023-05-30 DIAGNOSIS — F251 Schizoaffective disorder, depressive type: Secondary | ICD-10-CM | POA: Diagnosis not present

## 2023-05-31 ENCOUNTER — Encounter: Payer: Medicare HMO | Admitting: Internal Medicine

## 2023-06-23 ENCOUNTER — Other Ambulatory Visit: Payer: Self-pay | Admitting: Internal Medicine

## 2023-06-27 ENCOUNTER — Encounter: Payer: Self-pay | Admitting: Student

## 2023-06-27 ENCOUNTER — Ambulatory Visit: Payer: Medicare HMO | Admitting: Student

## 2023-06-27 VITALS — BP 114/66 | HR 72 | Temp 97.4°F | Resp 17 | Ht 62.0 in | Wt 141.1 lb

## 2023-06-27 DIAGNOSIS — E538 Deficiency of other specified B group vitamins: Secondary | ICD-10-CM

## 2023-06-27 DIAGNOSIS — E785 Hyperlipidemia, unspecified: Secondary | ICD-10-CM

## 2023-06-27 DIAGNOSIS — F419 Anxiety disorder, unspecified: Secondary | ICD-10-CM | POA: Diagnosis not present

## 2023-06-27 DIAGNOSIS — F330 Major depressive disorder, recurrent, mild: Secondary | ICD-10-CM

## 2023-06-27 DIAGNOSIS — K219 Gastro-esophageal reflux disease without esophagitis: Secondary | ICD-10-CM

## 2023-06-27 DIAGNOSIS — K5904 Chronic idiopathic constipation: Secondary | ICD-10-CM | POA: Insufficient documentation

## 2023-06-27 DIAGNOSIS — Z1329 Encounter for screening for other suspected endocrine disorder: Secondary | ICD-10-CM | POA: Diagnosis not present

## 2023-06-27 DIAGNOSIS — F32A Depression, unspecified: Secondary | ICD-10-CM

## 2023-06-27 DIAGNOSIS — Z0000 Encounter for general adult medical examination without abnormal findings: Secondary | ICD-10-CM | POA: Diagnosis not present

## 2023-06-27 DIAGNOSIS — E119 Type 2 diabetes mellitus without complications: Secondary | ICD-10-CM | POA: Diagnosis not present

## 2023-06-27 DIAGNOSIS — Z6825 Body mass index (BMI) 25.0-25.9, adult: Secondary | ICD-10-CM | POA: Diagnosis not present

## 2023-06-27 DIAGNOSIS — Z860100 Personal history of colon polyps, unspecified: Secondary | ICD-10-CM | POA: Insufficient documentation

## 2023-06-27 DIAGNOSIS — F209 Schizophrenia, unspecified: Secondary | ICD-10-CM | POA: Diagnosis not present

## 2023-06-27 MED ORDER — RISPERIDONE 3 MG PO TABS: 3.0000 mg | ORAL_TABLET | Freq: Every day | ORAL | 1 refills | Status: DC

## 2023-06-27 MED ORDER — ONETOUCH DELICA PLUS LANCET33G MISC: 3 refills | Status: DC

## 2023-06-27 MED ORDER — ONETOUCH VERIO VI STRP: ORAL_STRIP | 1 refills | Status: DC

## 2023-06-27 MED ORDER — METFORMIN HCL 1000 MG PO TABS: 1000.0000 mg | ORAL_TABLET | Freq: Two times a day (BID) | ORAL | 3 refills | Status: DC

## 2023-06-27 MED ORDER — BENZTROPINE MESYLATE 1 MG PO TABS: 1.0000 mg | ORAL_TABLET | Freq: Two times a day (BID) | ORAL | 1 refills | Status: AC | PRN

## 2023-06-27 MED ORDER — ESCITALOPRAM OXALATE 10 MG PO TABS: 10.0000 mg | ORAL_TABLET | Freq: Every day | ORAL | 1 refills | Status: AC

## 2023-06-27 MED ORDER — SITAGLIPTIN PHOSPHATE 100 MG PO TABS: 100.0000 mg | ORAL_TABLET | Freq: Every day | ORAL | 1 refills | Status: DC

## 2023-06-27 MED ORDER — OMEPRAZOLE 20 MG PO CPDR: 20.0000 mg | DELAYED_RELEASE_CAPSULE | ORAL | 1 refills | Status: DC

## 2023-06-27 NOTE — Assessment & Plan Note (Signed)
Same as above

## 2023-06-27 NOTE — Assessment & Plan Note (Signed)
I will collect microalbumin urine and protein urine.  I have sent in refills for metformin and Januvia along with lancets and strips.

## 2023-06-27 NOTE — Assessment & Plan Note (Signed)
She will continue vitamin B12 injections.

## 2023-06-27 NOTE — Assessment & Plan Note (Signed)
Quality metrics have been addressed for this [facility] Medicare advantage patient.  She declines Pap smear today.

## 2023-06-27 NOTE — Assessment & Plan Note (Signed)
Same as above.  Refills have been sent.

## 2023-06-27 NOTE — Assessment & Plan Note (Signed)
I will check labs today.  She will remain on rosuvastatin 40 mg daily.

## 2023-06-27 NOTE — Assessment & Plan Note (Addendum)
She is currently stable.  She will continue management with [facility].  I will refill all psychiatric meds as she says she is almost out.

## 2023-06-27 NOTE — Progress Notes (Addendum)
Preventive Screening-Counseling & Management     Kayla Guzman is a 62 y.o. female who presents for Medicare Annual/Subsequent preventive examination.  HPI    Kayla Guzman is a 62   year old Caucasian/White female who presents for her annual health maintenance exam. She is due for the following health maintenance studies: visual exam, colonoscopy, and screening labs. This patient's past medical history Anxiety Disorder, Depression, Diabetes Mellitus, Type II, GERD, Hypercholesterolemia, and Schizophrenia.   She had a dilated eye exam on 05/2023 which showed bilateral cataracts and no diabetic retinopathy. Her last colonoscopy was on 02/22/2022. She has a history of chronic constipation where she is on miralax and fiber. Last FIT 02/15/2021 and this was negative. She does have a history of GERD which is controlled on omeprazole. Her last digital screening mammogram was done on 07/14/2021 and this was normal. She does not get yearly flu vaccines. She did have a pneumovax 23 vaccine per the patient in 2016. She has not had any of the shingles vaccines. She has not had any of the COVID-19 vaccines.  She is due for Pap with HPV cotesting but declines at this time.  Diabetes type 2: She was diagnosed with T2 diabetes in 2015. Since the last visit, there have been no problems. She remains on Januvia 100 mg daily and metformin 1000mg  BID. She is walking at least an hour and a half daily.  She specifically denies unexplained abdominal pain, nausea or vomiting, or documented hypoglycemia.  Her blood sugars 90-150. Her last HgbA1c was done January 2024 and was 5.9%. She denies any long term complications of diabetic neuropathy, nephropathy, or cardiovascular disease.  Hyperlipidemia: She was diagnosed with HCL in 2015. Overall, she states she is doing well and is without any complaints or problems at this time. She specifically denies abdominal pain, nausea, vomiting, diarrhea, myalgias, and fatigue. She remains on  dietary management as well as the following cholesterol lowering medications rosuvastatin 40 mg oral tablet daily.    Schizophrenia: schizophrenia and depression/anxiety is followed by [facility] where she sees them every 3 months.   She has no visual hallucinations.   She does see the psychiatrist who writes her meds but she does not see counselling at this time. She was diagnosed with schizophrenia and depression/anxiety at the age of 14. Today, she remains on risperdone 3mg  daily, Lexapro 10mg  daily and congentin 1 mg BID prn. She reports no additional symptoms. She denies difficulty concentrating, difficulty performing routine daily activities, fatigue, extreme feelings of guilt, feelings of isolation, feelings of worthlessness, helpless feeling, weight loss, insomnia, loss of appetite, social withdrawal, loss of interest in pleasurable activities, out of control feelings, and panic attacks. She currently lives with her daughter. She has been admitted multiple to a psychiatric facility and her last admission was about 8-9 years ago.    The patient also was told he had a Vit B12 defiency on blood work years ago.  She has been on vitamin B12 and was started on Vitamin B12 injections which she gets monthly.  The last vitamin B12 injection was July 2024.        06/27/2023    8:38 AM  MMSE - Mini Mental State Exam  Orientation to time 5  Orientation to Place 5  Registration 3  Attention/ Calculation 5  Recall 3  Language- name 2 objects 2  Language- repeat 1  Language- follow 3 step command 3  Language- read & follow direction 1  Write a sentence 1  Copy design 1  Total score 30       Diabetic Foot Form - Detailed   Diabetic Foot Exam - detailed Diabetic Foot exam was performed with the following findings: Yes 06/27/2023  8:30 AM  Can the patient see the bottom of their feet?: No Are the shoes appropriate in style and fit?: No Is there swelling or and abnormal foot shape?: No Is there a  claw toe deformity?: No Is there elevated skin temparature?: No Is there foot or ankle muscle weakness?: No Normal Range of Motion: No Pulse Foot Exam completed.: Yes   Right posterior Tibialias: Present Left posterior Tibialias: Present   Right Dorsalis Pedis: Present Left Dorsalis Pedis: Present  Sensory Foot Exam Completed.: Yes Semmes-Weinstein Monofilament Test R Site 1-Great Toe: Pos L Site 1-Great Toe: Pos            Are there smokers in your home (other than you)? No  Risk Factors Current exercise habits: Home exercise routine includes walking 1:30 hrs per day.  Dietary issues discussed: none    Depression Screen (Note: if answer to either of the following is "Yes", a more complete depression screening is indicated)   Over the past two weeks, have you felt down, depressed or hopeless? No  Over the past two weeks, have you felt little interest or pleasure in doing things? No  Have you lost interest or pleasure in daily life? No  Do you often feel hopeless? No  Do you cry easily over simple problems? No  Activities of Daily Living In your present state of health, do you have any difficulty performing the following activities?:  Driving? No Managing money?  No Feeding yourself? No Getting from bed to chair? No Climbing a flight of stairs? No Preparing food and eating?: No Bathing or showering? No Getting dressed: No Getting to the toilet? No Using the toilet:No Moving around from place to place: No In the past year have you fallen or had a near fall?:No   Are you sexually active?  No  Do you have more than one partner?  No  Hearing Difficulties: No Do you often ask people to speak up or repeat themselves? No Do you experience ringing or noises in your ears? No Do you have difficulty understanding soft or whispered voices? No   Do you feel that you have a problem with memory? No  Do you often misplace items? No  Do you feel safe at home?  Yes  Cognitive  Testing  Alert? Yes  Normal Appearance?Yes  Oriented to person? Yes  Place? Yes   Time? Yes  Recall of three objects?  Yes  Can perform simple calculations? Yes  Displays appropriate judgment?Yes  Can read the correct time from a watch face?Yes  Fall Risk Prevention  Any stairs in or around the home? Yes  If so, are there any without handrails? Yes  Home free of loose throw rugs in walkways, pet beds, electrical cords, etc? Yes  Adequate lighting in your home to reduce risk of falls? Yes  Use of a cane, walker or w/c? No    Time Up and Go  Was the test performed? Yes .  Length of time to ambulate 10 feet: 3 sec.   Gait steady and fast without use of assistive device    Advanced Directives have been discussed with the patient? Yes   List the Names of Other Physician/Practitioners you currently use: Patient Care Team: Crist Fat, MD as PCP -  General (Internal Medicine)    Past Medical History:  Diagnosis Date   Anxiety    Depression    Diabetes mellitus, type 2 (HCC)    GERD (gastroesophageal reflux disease)    Hypercholesteremia    Schizophrenia (HCC)     No past surgical history on file.    Current Medications  Current Outpatient Medications  Medication Sig Dispense Refill   benztropine (COGENTIN) 1 MG tablet Take 1 tablet (1 mg total) by mouth 2 (two) times daily as needed. 90 tablet 1   escitalopram (LEXAPRO) 10 MG tablet Take 1 tablet (10 mg total) by mouth daily. 90 tablet 1   glucose blood (ONETOUCH VERIO) test strip Use as instructed 700 strip 1   Lancets (ONETOUCH DELICA PLUS LANCET33G) MISC Check blood sugar with lancets twice daily 100 each 3   metFORMIN (GLUCOPHAGE) 1000 MG tablet Take 1 tablet (1,000 mg total) by mouth 2 (two) times daily with a meal. 180 tablet 3   omeprazole (PRILOSEC) 20 MG capsule Take 1 capsule (20 mg total) by mouth every other day. 90 capsule 1   risperiDONE (RISPERDAL) 3 MG tablet Take 1 tablet (3 mg total) by mouth at  bedtime. 90 tablet 1   rosuvastatin (CRESTOR) 40 MG tablet TAKE 1 TABLET(40 MG) BY MOUTH EVERY DAY 90 tablet 0   sitaGLIPtin (JANUVIA) 100 MG tablet Take 1 tablet (100 mg total) by mouth daily. 90 tablet 1   No current facility-administered medications for this visit.    Allergies Sambucus nigra and Ziprasidone   Social History Social History   Tobacco Use   Smoking status: Never   Smokeless tobacco: Never  Substance Use Topics   Alcohol use: Never     Review of Systems Review of Systems  Constitutional: Negative.   HENT: Negative.    Eyes: Negative.   Respiratory: Negative.    Cardiovascular: Negative.   Gastrointestinal: Negative.   Genitourinary: Negative.   Musculoskeletal: Negative.   Skin: Negative.   Neurological: Negative.   Endo/Heme/Allergies: Negative.   Psychiatric/Behavioral:  Positive for depression. The patient is nervous/anxious.      Physical Exam:      Body mass index is 25.81 kg/m. BP 114/66 (BP Location: Right Arm, Patient Position: Sitting, Cuff Size: Normal)   Pulse 72   Temp (!) 97.4 F (36.3 C)   Resp 17   Ht 5\' 2"  (1.575 m)   Wt 141 lb 2 oz (64 kg)   SpO2 98%   BMI 25.81 kg/m   Physical Exam Vitals reviewed.  Constitutional:      Appearance: Normal appearance.  HENT:     Head: Normocephalic.     Right Ear: Tympanic membrane, ear canal and external ear normal.     Left Ear: Tympanic membrane, ear canal and external ear normal.     Nose: Nose normal.     Mouth/Throat:     Mouth: Mucous membranes are moist.     Pharynx: Oropharynx is clear.  Eyes:     Extraocular Movements: Extraocular movements intact.     Pupils: Pupils are equal, round, and reactive to light.  Cardiovascular:     Rate and Rhythm: Normal rate and regular rhythm.     Pulses: Normal pulses.     Heart sounds: Normal heart sounds.  Pulmonary:     Effort: Pulmonary effort is normal.     Breath sounds: Normal breath sounds.  Abdominal:     General: Abdomen  is flat. Bowel sounds are normal.  Palpations: Abdomen is soft.  Musculoskeletal:        General: Normal range of motion.     Cervical back: Normal range of motion and neck supple. No rigidity.  Lymphadenopathy:     Cervical: No cervical adenopathy.  Skin:    General: Skin is warm and dry.     Capillary Refill: Capillary refill takes less than 2 seconds.  Neurological:     Mental Status: She is alert and oriented to person, place, and time.  Psychiatric:        Mood and Affect: Mood normal.      Assessment:     Encounter for annual wellness exam in Medicare patient Assessment & Plan: Quality metrics have been addressed for this [facility] Medicare advantage patient.  She declines Pap smear today.  Orders: -     Microalbumin / creatinine urine ratio -     Microalbumin, urine -     Protein,Total,Urine -     Omeprazole; Take 1 capsule (20 mg total) by mouth every other day.  Dispense: 90 capsule; Refill: 1 -     Hemoglobin A1c -     VITAMIN D 25 Hydroxy (Vit-D Deficiency, Fractures)  T2 Diabetes mellitus without complication (HCC) Assessment & Plan: I will collect microalbumin urine and protein urine.  I have sent in refills for metformin and Januvia along with lancets and strips.   Orders: -     OneTouch Verio; Use as instructed  Dispense: 700 strip; Refill: 1 -     OneTouch Delica Plus Lancet33G; Check blood sugar with lancets twice daily  Dispense: 100 each; Refill: 3 -     metFORMIN HCl; Take 1 tablet (1,000 mg total) by mouth 2 (two) times daily with a meal.  Dispense: 180 tablet; Refill: 3 -     SITagliptin Phosphate; Take 1 tablet (100 mg total) by mouth daily.  Dispense: 90 tablet; Refill: 1 -     CMP14 + Anion Gap  Schizophrenia, unspecified type (HCC) Assessment & Plan: She is currently stable.  She will continue management with [facility].  I will refill all psychiatric meds as she says she is almost out.  Orders: -     Benztropine Mesylate; Take 1 tablet (1 mg  total) by mouth 2 (two) times daily as needed.  Dispense: 90 tablet; Refill: 1 -     Escitalopram Oxalate; Take 1 tablet (10 mg total) by mouth daily.  Dispense: 90 tablet; Refill: 1 -     risperiDONE; Take 1 tablet (3 mg total) by mouth at bedtime.  Dispense: 90 tablet; Refill: 1  Gastroesophageal reflux disease, unspecified whether esophagitis present Assessment & Plan: Refilled omeprazole    Depression, unspecified depression type Assessment & Plan: Same as above   Anxiety Assessment & Plan: Same as above.  Refills have been sent.   Major depressive disorder, recurrent episode, mild (HCC) Assessment & Plan: Same as above.   Hyperlipidemia, unspecified hyperlipidemia type Assessment & Plan: I will check labs today.  She will remain on rosuvastatin 40 mg daily.   Deficiency of other specified B group vitamins Assessment & Plan: She will continue vitamin B12 injections.        Plan:     During the course of the visit the patient was educated and counseled about appropriate screening and preventive services including:   Diabetes screening Glaucoma screening  Diet review for nutrition referral? Yes ____  Not Indicated ___x_    Medicare Attestation I have personally reviewed: The patient's  medical and social history Their use of alcohol, tobacco or illicit drugs Their current medications and supplements The patient's functional ability including ADLs,fall risks, home safety risks, cognitive, and hearing and visual impairment Diet and physical activities Evidence for depression or mood disorders  The patient's weight, height, and BMI have been recorded in the chart.  I have made referrals, counseling, and provided education to the patient based on review of the above and I have provided the patient with a written personalized care plan for preventive services.     Dagan Heinz L Tazaria Dlugosz, NP   06/27/2023    I have spent 45 minutes face to face with the patient, over  half in discussion of the diagnosis and the importance of compliance with the treatment plan.

## 2023-06-27 NOTE — Assessment & Plan Note (Signed)
Refilled omeprazole.

## 2023-06-28 ENCOUNTER — Other Ambulatory Visit: Payer: Self-pay

## 2023-06-28 ENCOUNTER — Ambulatory Visit: Payer: Medicare HMO

## 2023-06-28 DIAGNOSIS — E538 Deficiency of other specified B group vitamins: Secondary | ICD-10-CM | POA: Diagnosis not present

## 2023-06-28 DIAGNOSIS — Z0000 Encounter for general adult medical examination without abnormal findings: Secondary | ICD-10-CM

## 2023-06-28 LAB — MICROALBUMIN / CREATININE URINE RATIO
Creatinine, Urine: 34.6 mg/dL
Microalb/Creat Ratio: <9 mg/g{creat} (ref 0–29)
Microalbumin, Urine: <3 ug/mL

## 2023-06-28 LAB — CMP14 + ANION GAP
ALT: 10 IU/L (ref 0–32)
AST: 14 IU/L (ref 0–40)
Albumin: 4.3 g/dL (ref 3.9–4.9)
Alkaline Phosphatase: 70 IU/L (ref 44–121)
Anion Gap: 12 mmol/L (ref 10.0–18.0)
BUN/Creatinine Ratio: 22 (ref 12–28)
BUN: 13 mg/dL (ref 8–27)
Bilirubin Total: 0.2 mg/dL (ref 0.0–1.2)
CO2: 25 mmol/L (ref 20–29)
Calcium: 9.5 mg/dL (ref 8.7–10.3)
Chloride: 90 mmol/L — ABNORMAL LOW (ref 96–106)
Creatinine, Ser: 0.59 mg/dL (ref 0.57–1.00)
Globulin, Total: 2.4 g/dL (ref 1.5–4.5)
Glucose: 80 mg/dL (ref 70–99)
Potassium: 5.4 mmol/L — ABNORMAL HIGH (ref 3.5–5.2)
Sodium: 127 mmol/L — ABNORMAL LOW (ref 134–144)
Total Protein: 6.7 g/dL (ref 6.0–8.5)
eGFR: 102 mL/min/{1.73_m2} (ref >59)

## 2023-06-28 LAB — VITAMIN D 25 HYDROXY (VIT D DEFICIENCY, FRACTURES): Vit D, 25-Hydroxy: 81.8 ng/mL (ref 30.0–100.0)

## 2023-06-28 LAB — PROTEIN,TOTAL,URINE: Protein, Ur: 8.3 mg/dL

## 2023-06-28 LAB — HEMOGLOBIN A1C
Est. average glucose Bld gHb Est-mCnc: 114 mg/dL
Hgb A1c MFr Bld: 5.6 % (ref 4.8–5.6)

## 2023-06-28 MED ORDER — CYANOCOBALAMIN 1000 MCG/ML IJ SOLN
1000.0000 ug | Freq: Once | INTRAMUSCULAR | Status: AC
  Administered 2023-06-28: 1000 ug via INTRAMUSCULAR

## 2023-06-28 NOTE — Progress Notes (Signed)
Nurse visit for B12 injection

## 2023-06-28 NOTE — Addendum Note (Signed)
Addended by: Edwena Blow on: 06/28/2023 09:52 AM   Modules accepted: Orders

## 2023-06-28 NOTE — Addendum Note (Signed)
Addended by: Christianne Dolin on: 06/28/2023 10:16 AM   Modules accepted: Orders

## 2023-06-29 LAB — FECAL OCCULT BLOOD, IMMUNOCHEMICAL: Fecal Occult Bld: NEGATIVE

## 2023-06-29 NOTE — Progress Notes (Signed)
I have reviewed the results of your recent test and lab work that you underwent at our clinic. Findings indicate normal kidney function, stable and controlled diabetes (5.6 YAY!), normal vitamin D, and there was not blood detection in stool sample.  Based on the test results, I would like to recommend the following course of action to address any health concern: Drink plenty of fluids.   Please do not hesitate to contact me if you have any questions of concerns regarding your health. I look forward to seeing you at your next appointment.

## 2023-07-31 ENCOUNTER — Ambulatory Visit: Payer: Medicare HMO

## 2023-07-31 DIAGNOSIS — E538 Deficiency of other specified B group vitamins: Secondary | ICD-10-CM

## 2023-07-31 MED ORDER — CYANOCOBALAMIN 1000 MCG/ML IJ SOLN
1000.0000 ug | Freq: Once | INTRAMUSCULAR | Status: AC
  Administered 2023-07-31: 1000 ug via INTRAMUSCULAR

## 2023-07-31 NOTE — Progress Notes (Signed)
Nurse visit for B12 injection

## 2023-08-20 ENCOUNTER — Other Ambulatory Visit: Payer: Self-pay | Admitting: Internal Medicine

## 2023-08-29 ENCOUNTER — Other Ambulatory Visit: Payer: Self-pay

## 2023-08-29 DIAGNOSIS — F251 Schizoaffective disorder, depressive type: Secondary | ICD-10-CM | POA: Diagnosis not present

## 2023-08-29 DIAGNOSIS — E119 Type 2 diabetes mellitus without complications: Secondary | ICD-10-CM

## 2023-08-29 MED ORDER — ONETOUCH DELICA PLUS LANCET33G MISC
3 refills | Status: DC
Start: 2023-08-29 — End: 2023-11-19

## 2023-08-29 MED ORDER — ONETOUCH VERIO VI STRP: ORAL_STRIP | 1 refills | Status: DC

## 2023-08-29 NOTE — Progress Notes (Signed)
Rx Refill

## 2023-09-03 ENCOUNTER — Ambulatory Visit: Payer: Medicare HMO | Admitting: Internal Medicine

## 2023-09-03 VITALS — BP 124/82 | HR 84 | Temp 98.6°F | Wt 160.0 lb

## 2023-09-03 DIAGNOSIS — E538 Deficiency of other specified B group vitamins: Secondary | ICD-10-CM | POA: Diagnosis not present

## 2023-09-03 MED ORDER — CYANOCOBALAMIN 1000 MCG/ML IJ SOLN
1000.0000 ug | Freq: Once | INTRAMUSCULAR | Status: AC
  Administered 2024-06-03: 1000 ug via INTRAMUSCULAR

## 2023-09-03 NOTE — Progress Notes (Signed)
Nurse visit  B12 shot

## 2023-09-04 ENCOUNTER — Other Ambulatory Visit: Payer: Self-pay | Admitting: Internal Medicine

## 2023-09-04 DIAGNOSIS — E119 Type 2 diabetes mellitus without complications: Secondary | ICD-10-CM

## 2023-09-04 MED ORDER — ONETOUCH VERIO VI STRP: ORAL_STRIP | 1 refills | Status: DC

## 2023-09-05 ENCOUNTER — Other Ambulatory Visit: Payer: Self-pay

## 2023-09-05 DIAGNOSIS — E119 Type 2 diabetes mellitus without complications: Secondary | ICD-10-CM

## 2023-09-25 ENCOUNTER — Ambulatory Visit: Payer: Medicare HMO | Admitting: Internal Medicine

## 2023-10-04 ENCOUNTER — Ambulatory Visit: Payer: Medicare HMO

## 2023-10-04 DIAGNOSIS — E538 Deficiency of other specified B group vitamins: Secondary | ICD-10-CM | POA: Diagnosis not present

## 2023-10-04 MED ORDER — CYANOCOBALAMIN 1000 MCG/ML IJ SOLN
1000.0000 ug | Freq: Once | INTRAMUSCULAR | Status: AC
  Administered 2023-10-04: 1000 ug via INTRAMUSCULAR

## 2023-10-04 NOTE — Progress Notes (Signed)
 Nurse visit only for B12 injection

## 2023-10-22 ENCOUNTER — Encounter: Payer: Self-pay | Admitting: Internal Medicine

## 2023-10-22 ENCOUNTER — Ambulatory Visit: Payer: Medicare HMO | Admitting: Internal Medicine

## 2023-10-22 VITALS — BP 112/62 | HR 58 | Temp 98.7°F | Resp 18 | Ht 62.0 in | Wt 140.6 lb

## 2023-10-22 DIAGNOSIS — R339 Retention of urine, unspecified: Secondary | ICD-10-CM | POA: Insufficient documentation

## 2023-10-22 DIAGNOSIS — E119 Type 2 diabetes mellitus without complications: Secondary | ICD-10-CM | POA: Diagnosis not present

## 2023-10-22 LAB — POCT URINALYSIS DIPSTICK
Appearance: NORMAL
Bilirubin, UA: NEGATIVE
Blood, UA: NEGATIVE
Glucose, UA: NEGATIVE
Ketones, UA: NEGATIVE
Nitrite, UA: NEGATIVE
Protein, UA: NEGATIVE
Spec Grav, UA: 1.02 (ref 1.010–1.025)
Urobilinogen, UA: 0.2 U/dL
pH, UA: 6 (ref 5.0–8.0)

## 2023-10-22 NOTE — Progress Notes (Signed)
 Office Visit  Subjective   Patient ID: Kayla Guzman   DOB: 1961/04/09   Age: 63 y.o.   MRN: [id number]   Chief Complaint Chief Complaint  Patient presents with   Acute Visit    Patient states she drinks tons of water but doesn't urinate     History of Present Illness The patient is a 63 year old Caucasian/White female who returns for a follow-up visit for her T2 diabetes. She was diagnosed with T2 diabetes in 2015. Since the last visit, there have been no problems.  She did try starting on a treadmill for exercising but she did not continue this regularly.  She remains on Januvia  100 mg daily and metformin  1000mg  BID. She is not walking as much as they would like. She specifically denies unexplained abdominal pain, nausea or vomiting, or documented hypoglycemia. She states she has not been checking her blood sugars recently.  She came in fasting today in anticipation of lab work. Her last HgbA1c was done 4 months ago and was 5.6%. She denies any long term complications of diabetic neuropathy, nephropathy, retinopathy or cardiovascular disease.  Her last dilated eye exam was done on 01/31/2023 and that showed no evidence of diabetic retinopathy.  The patient comes in today to discuss her drinking a lot of water but not urinating well. She is also trying to lose weight and has been watching her diet and watching portion control.     Past Medical History Past Medical History:  Diagnosis Date   Anxiety    Depression    Diabetes mellitus, type 2 (HCC)    GERD (gastroesophageal reflux disease)    Hypercholesteremia    Schizophrenia (HCC)      Allergies Allergies  Allergen Reactions   Sambucus Nigra    Ziprasidone Rash     Medications  Current Outpatient Medications:    benztropine  (COGENTIN ) 1 MG tablet, Take 1 tablet (1 mg total) by mouth 2 (two) times daily as needed., Disp: 90 tablet, Rfl: 1   escitalopram  (LEXAPRO ) 10 MG tablet, Take 1 tablet (10 mg total) by mouth daily.,  Disp: 90 tablet, Rfl: 1   glucose blood (ONETOUCH VERIO) test strip, Use as instructed, Disp: 180 strip, Rfl: 1   Lancets (ONETOUCH DELICA PLUS LANCET33G) MISC, Check blood sugar with lancets twice daily, Disp: 100 each, Rfl: 3   metFORMIN  (GLUCOPHAGE ) 1000 MG tablet, Take 1 tablet (1,000 mg total) by mouth 2 (two) times daily with a meal., Disp: 180 tablet, Rfl: 3   omeprazole  (PRILOSEC) 20 MG capsule, Take 1 capsule (20 mg total) by mouth every other day., Disp: 90 capsule, Rfl: 1   risperiDONE  (RISPERDAL ) 3 MG tablet, Take 1 tablet (3 mg total) by mouth at bedtime., Disp: 90 tablet, Rfl: 1   rosuvastatin  (CRESTOR ) 40 MG tablet, TAKE 1 TABLET(40 MG) BY MOUTH EVERY DAY, Disp: 90 tablet, Rfl: 0   sitaGLIPtin  (JANUVIA ) 100 MG tablet, Take 1 tablet (100 mg total) by mouth daily., Disp: 90 tablet, Rfl: 1  Current Facility-Administered Medications:    cyanocobalamin  (VITAMIN B12) injection 1,000 mcg, 1,000 mcg, Intramuscular, Once, Amin, Saad, MD   Review of Systems Review of Systems  Constitutional:  Negative for chills and fever.  Eyes:  Negative for blurred vision and double vision.  Respiratory:  Negative for cough and shortness of breath.   Cardiovascular:  Negative for chest pain and leg swelling.  Gastrointestinal:  Negative for abdominal pain, constipation, diarrhea, nausea and vomiting.  Genitourinary:  Negative for  dysuria, frequency and hematuria.  Neurological:  Negative for dizziness, weakness and headaches.       Objective:    Vitals BP 112/62   Pulse (!) 58   Temp 98.7 F (37.1 C)   Resp 18   Ht 5' 2 (1.575 m)   Wt 140 lb 9.6 oz (63.8 kg)   SpO2 98%   BMI 25.72 kg/m    Physical Examination Physical Exam Constitutional:      Appearance: Normal appearance. She is not ill-appearing.  Cardiovascular:     Rate and Rhythm: Normal rate and regular rhythm.     Pulses: Normal pulses.     Heart sounds: No murmur heard.    No friction rub. No gallop.  Pulmonary:      Effort: Pulmonary effort is normal. No respiratory distress.     Breath sounds: No wheezing, rhonchi or rales.  Abdominal:     General: Bowel sounds are normal. There is no distension.     Palpations: Abdomen is soft.     Tenderness: There is no abdominal tenderness.  Musculoskeletal:     Right lower leg: No edema.     Left lower leg: No edema.  Skin:    General: Skin is warm and dry.     Findings: No rash.  Neurological:     Mental Status: She is alert.        Assessment & Plan:   T2 Diabetes mellitus without complication (HCC) She is drinking a lot per her history and not urinating a lot.  She has cut back on drinking soft drinks.  She has lost 20 lbs since watching her diet.  I will check a HGBA1c on her today, a CMP and UA.    Return in about 3 months (around 01/20/2024) for annual.   Signe Tackitt Van Eyk, MD

## 2023-10-22 NOTE — Assessment & Plan Note (Signed)
 She is drinking a lot per her history and not urinating a lot.  She has cut back on drinking soft drinks.  She has lost 20 lbs since watching her diet.  I will check a HGBA1c on her today, a CMP and UA.

## 2023-10-23 LAB — CMP14 + ANION GAP
ALT: 9 [IU]/L (ref 0–32)
AST: 15 [IU]/L (ref 0–40)
Albumin: 4.4 g/dL (ref 3.9–4.9)
Alkaline Phosphatase: 83 [IU]/L (ref 44–121)
Anion Gap: 14 mmol/L (ref 10.0–18.0)
BUN/Creatinine Ratio: 10 — ABNORMAL LOW (ref 12–28)
BUN: 8 mg/dL (ref 8–27)
Bilirubin Total: 0.3 mg/dL (ref 0.0–1.2)
CO2: 24 mmol/L (ref 20–29)
Calcium: 9.2 mg/dL (ref 8.7–10.3)
Chloride: 99 mmol/L (ref 96–106)
Creatinine, Ser: 0.83 mg/dL (ref 0.57–1.00)
Globulin, Total: 2.4 g/dL (ref 1.5–4.5)
Glucose: 90 mg/dL (ref 70–99)
Potassium: 4.7 mmol/L (ref 3.5–5.2)
Sodium: 137 mmol/L (ref 134–144)
Total Protein: 6.8 g/dL (ref 6.0–8.5)
eGFR: 80 mL/min/{1.73_m2} (ref >59)

## 2023-10-23 LAB — HEMOGLOBIN A1C
Est. average glucose Bld gHb Est-mCnc: 114 mg/dL
Hgb A1c MFr Bld: 5.6 % (ref 4.8–5.6)

## 2023-11-01 NOTE — Progress Notes (Signed)
Her labs are normal. Keep an eye on her urination. UA, A1c and other labs are normal.  Patient aware of labs

## 2023-11-06 ENCOUNTER — Ambulatory Visit: Payer: Medicare HMO

## 2023-11-06 DIAGNOSIS — E538 Deficiency of other specified B group vitamins: Secondary | ICD-10-CM

## 2023-11-06 MED ORDER — CYANOCOBALAMIN 1000 MCG/ML IJ SOLN
1000.0000 ug | Freq: Once | INTRAMUSCULAR | Status: AC
  Administered 2023-11-06: 1000 ug via INTRAMUSCULAR

## 2023-11-06 NOTE — Progress Notes (Signed)
Nurse visit for B12 injection

## 2023-11-12 ENCOUNTER — Encounter: Payer: Self-pay | Admitting: Internal Medicine

## 2023-11-12 ENCOUNTER — Ambulatory Visit: Payer: Medicare HMO | Admitting: Internal Medicine

## 2023-11-12 VITALS — BP 118/62 | HR 72 | Temp 98.6°F | Resp 17 | Ht 62.0 in | Wt 140.4 lb

## 2023-11-12 DIAGNOSIS — N390 Urinary tract infection, site not specified: Secondary | ICD-10-CM | POA: Diagnosis not present

## 2023-11-12 DIAGNOSIS — R34 Anuria and oliguria: Secondary | ICD-10-CM | POA: Insufficient documentation

## 2023-11-12 MED ORDER — AMOXICILLIN-POT CLAVULANATE 875-125 MG PO TABS: 1.0000 | ORAL_TABLET | Freq: Two times a day (BID) | ORAL | 0 refills | Status: DC

## 2023-11-12 NOTE — Assessment & Plan Note (Signed)
We will check a BMP on her today.

## 2023-11-12 NOTE — Progress Notes (Signed)
Office Visit  Subjective   Patient ID: Kayla Guzman   DOB: 03/20/1961   Age: 63 y.o.   MRN: [id number]   Chief Complaint Chief Complaint  Patient presents with   Follow-up     History of Present Illness Kayla Guzman is a 63 yo female who comes in today with acute complaints with urinary hestiancy.  She states she drinks a lot of fluid but urinates very little.  This started about a month ago.  We did check her blood work on 10/22/2023 and her BUN and creatinine were normal at that time.  Today, there is no dysuria, hematuria, fevers, chills, abdominal pain, nausea, vomiting or other problems.     Past Medical History Past Medical History:  Diagnosis Date   Anxiety    Depression    Diabetes mellitus, type 2 (HCC)    GERD (gastroesophageal reflux disease)    Hypercholesteremia    Schizophrenia (HCC)      Allergies Allergies  Allergen Reactions   Sambucus Nigra    Ziprasidone Rash     Medications  Current Outpatient Medications:    benztropine (COGENTIN) 1 MG tablet, Take 1 tablet (1 mg total) by mouth 2 (two) times daily as needed., Disp: 90 tablet, Rfl: 1   escitalopram (LEXAPRO) 10 MG tablet, Take 1 tablet (10 mg total) by mouth daily., Disp: 90 tablet, Rfl: 1   glucose blood (ONETOUCH VERIO) test strip, Use as instructed, Disp: 180 strip, Rfl: 1   Lancets (ONETOUCH DELICA PLUS LANCET33G) MISC, Check blood sugar with lancets twice daily, Disp: 100 each, Rfl: 3   metFORMIN (GLUCOPHAGE) 1000 MG tablet, Take 1 tablet (1,000 mg total) by mouth 2 (two) times daily with a meal., Disp: 180 tablet, Rfl: 3   omeprazole (PRILOSEC) 20 MG capsule, Take 1 capsule (20 mg total) by mouth every other day., Disp: 90 capsule, Rfl: 1   risperiDONE (RISPERDAL) 3 MG tablet, Take 1 tablet (3 mg total) by mouth at bedtime., Disp: 90 tablet, Rfl: 1   rosuvastatin (CRESTOR) 40 MG tablet, TAKE 1 TABLET(40 MG) BY MOUTH EVERY DAY, Disp: 90 tablet, Rfl: 0   sitaGLIPtin (JANUVIA) 100 MG tablet, Take 1  tablet (100 mg total) by mouth daily., Disp: 90 tablet, Rfl: 1  Current Facility-Administered Medications:    cyanocobalamin (VITAMIN B12) injection 1,000 mcg, 1,000 mcg, Intramuscular, Once, Eloisa Northern, MD   Review of Systems Review of Systems  Constitutional:  Negative for chills and fever.  Respiratory:  Negative for cough and shortness of breath.   Cardiovascular:  Negative for chest pain and palpitations.  Gastrointestinal:  Negative for abdominal pain, constipation, diarrhea, nausea and vomiting.  Genitourinary:  Positive for urgency. Negative for dysuria, flank pain, frequency and hematuria.  Skin:  Negative for itching and rash.  Neurological:  Negative for dizziness, weakness and headaches.       Objective:    Vitals BP 118/62   Pulse 72   Temp 98.6 F (37 C)   Resp 17   Ht 5\' 2"  (1.575 m)   Wt 140 lb 6.4 oz (63.7 kg)   SpO2 98%   BMI 25.68 kg/m    Physical Examination Physical Exam Constitutional:      Appearance: Normal appearance. She is not ill-appearing.  Cardiovascular:     Rate and Rhythm: Normal rate and regular rhythm.     Pulses: Normal pulses.     Heart sounds: No murmur heard.    No friction rub. No gallop.  Pulmonary:  Effort: Pulmonary effort is normal. No respiratory distress.     Breath sounds: No wheezing, rhonchi or rales.  Abdominal:     General: Bowel sounds are normal. There is no distension.     Palpations: Abdomen is soft.     Tenderness: There is no abdominal tenderness.  Musculoskeletal:     Right lower leg: No edema.     Left lower leg: No edema.  Skin:    General: Skin is warm and dry.     Findings: No rash.  Neurological:     Mental Status: She is alert.        Assessment & Plan:   Decreased urine output We will check a BMP on her today.  Urinary tract infection without hematuria The patient has a UTI at this time seen on her UA.  There is no blood.  We will send for culture and start her empicircally on  augmentin.    No follow-ups on file.   Crist Fat, MD

## 2023-11-12 NOTE — Assessment & Plan Note (Signed)
The patient has a UTI at this time seen on her UA.  There is no blood.  We will send for culture and start her empicircally on augmentin.

## 2023-11-13 LAB — SPECIMEN STATUS REPORT

## 2023-11-13 LAB — BASIC METABOLIC PANEL
BUN/Creatinine Ratio: 15 (ref 12–28)
BUN: 10 mg/dL (ref 8–27)
CO2: 22 mmol/L (ref 20–29)
Calcium: 8.6 mg/dL — ABNORMAL LOW (ref 8.7–10.3)
Chloride: 91 mmol/L — ABNORMAL LOW (ref 96–106)
Creatinine, Ser: 0.68 mg/dL (ref 0.57–1.00)
Glucose: 108 mg/dL — ABNORMAL HIGH (ref 70–99)
Potassium: 3.9 mmol/L (ref 3.5–5.2)
Sodium: 128 mmol/L — ABNORMAL LOW (ref 134–144)
eGFR: 98 mL/min/{1.73_m2} (ref >59)

## 2023-11-14 ENCOUNTER — Other Ambulatory Visit: Payer: Self-pay

## 2023-11-14 DIAGNOSIS — E119 Type 2 diabetes mellitus without complications: Secondary | ICD-10-CM

## 2023-11-19 ENCOUNTER — Other Ambulatory Visit: Payer: Self-pay

## 2023-11-19 DIAGNOSIS — E119 Type 2 diabetes mellitus without complications: Secondary | ICD-10-CM

## 2023-11-19 MED ORDER — ONETOUCH VERIO VI STRP: ORAL_STRIP | 1 refills | Status: AC

## 2023-11-19 MED ORDER — ONETOUCH DELICA PLUS LANCET33G MISC: 3 refills | Status: AC

## 2023-11-19 MED ORDER — ROSUVASTATIN CALCIUM 40 MG PO TABS: 40.0000 mg | ORAL_TABLET | Freq: Every day | ORAL | 0 refills | Status: DC

## 2023-11-19 NOTE — Progress Notes (Signed)
 Rx refill

## 2023-11-22 ENCOUNTER — Other Ambulatory Visit: Payer: Self-pay

## 2023-11-22 DIAGNOSIS — E119 Type 2 diabetes mellitus without complications: Secondary | ICD-10-CM

## 2023-11-22 MED ORDER — ACCU-CHEK FASTCLIX LANCETS MISC: 2 refills | Status: AC

## 2023-11-22 MED ORDER — ACCU-CHEK AVIVA PLUS VI STRP: ORAL_STRIP | 12 refills | Status: AC

## 2023-11-22 MED ORDER — ACCU-CHEK AVIVA PLUS W/DEVICE KIT: 1.0000 | PACK | Freq: Three times a day (TID) | 0 refills | Status: AC

## 2023-11-22 NOTE — Progress Notes (Signed)
 RX

## 2023-11-28 DIAGNOSIS — F251 Schizoaffective disorder, depressive type: Secondary | ICD-10-CM | POA: Diagnosis not present

## 2023-11-30 ENCOUNTER — Other Ambulatory Visit: Payer: Self-pay

## 2023-11-30 DIAGNOSIS — Z0000 Encounter for general adult medical examination without abnormal findings: Secondary | ICD-10-CM

## 2023-11-30 DIAGNOSIS — E119 Type 2 diabetes mellitus without complications: Secondary | ICD-10-CM

## 2023-11-30 MED ORDER — SITAGLIPTIN PHOSPHATE 100 MG PO TABS: 100.0000 mg | ORAL_TABLET | Freq: Every day | ORAL | 1 refills | Status: DC

## 2023-11-30 MED ORDER — OMEPRAZOLE 20 MG PO CPDR: 20.0000 mg | DELAYED_RELEASE_CAPSULE | ORAL | 1 refills | Status: AC

## 2023-11-30 NOTE — Progress Notes (Signed)
Rx

## 2023-11-30 NOTE — Progress Notes (Signed)
Her sodium level was a bit low but her kidney function was normal. Otherwise it's ok. We will continue to follow.  Patient aware of results

## 2023-11-30 NOTE — Progress Notes (Signed)
Rx refill

## 2023-12-10 ENCOUNTER — Ambulatory Visit: Payer: Medicare HMO

## 2023-12-10 VITALS — Wt 141.6 lb

## 2023-12-10 DIAGNOSIS — E538 Deficiency of other specified B group vitamins: Secondary | ICD-10-CM

## 2023-12-10 MED ORDER — CYANOCOBALAMIN 1000 MCG/ML IJ SOLN
1000.0000 ug | Freq: Once | INTRAMUSCULAR | Status: AC
  Administered 2023-12-10: 1000 ug via INTRAMUSCULAR

## 2023-12-10 NOTE — Progress Notes (Signed)
 Nurse Visit B12

## 2024-01-08 ENCOUNTER — Ambulatory Visit: Payer: Medicare HMO | Admitting: Internal Medicine

## 2024-01-08 DIAGNOSIS — E538 Deficiency of other specified B group vitamins: Secondary | ICD-10-CM | POA: Diagnosis not present

## 2024-01-08 MED ORDER — CYANOCOBALAMIN 1000 MCG/ML IJ SOLN
1000.0000 ug | Freq: Once | INTRAMUSCULAR | Status: AC
Start: 2024-01-08 — End: 2024-01-08
  Administered 2024-01-08: 1000 ug via INTRAMUSCULAR

## 2024-01-08 NOTE — Progress Notes (Signed)
 Nurse Visit only B12 injection

## 2024-01-25 ENCOUNTER — Encounter: Payer: Medicare HMO | Admitting: Internal Medicine

## 2024-02-12 ENCOUNTER — Ambulatory Visit

## 2024-02-18 DIAGNOSIS — F251 Schizoaffective disorder, depressive type: Secondary | ICD-10-CM | POA: Diagnosis not present

## 2024-02-18 DIAGNOSIS — Z79899 Other long term (current) drug therapy: Secondary | ICD-10-CM | POA: Diagnosis not present

## 2024-02-27 DIAGNOSIS — F251 Schizoaffective disorder, depressive type: Secondary | ICD-10-CM | POA: Diagnosis not present

## 2024-03-24 DIAGNOSIS — F251 Schizoaffective disorder, depressive type: Secondary | ICD-10-CM | POA: Diagnosis not present

## 2024-04-22 DIAGNOSIS — F251 Schizoaffective disorder, depressive type: Secondary | ICD-10-CM | POA: Diagnosis not present

## 2024-05-20 DIAGNOSIS — F251 Schizoaffective disorder, depressive type: Secondary | ICD-10-CM | POA: Diagnosis not present

## 2024-05-20 DIAGNOSIS — E119 Type 2 diabetes mellitus without complications: Secondary | ICD-10-CM | POA: Diagnosis not present

## 2024-05-20 DIAGNOSIS — H5213 Myopia, bilateral: Secondary | ICD-10-CM | POA: Diagnosis not present

## 2024-06-03 ENCOUNTER — Ambulatory Visit: Admitting: Internal Medicine

## 2024-06-03 DIAGNOSIS — E538 Deficiency of other specified B group vitamins: Secondary | ICD-10-CM

## 2024-06-03 NOTE — Progress Notes (Signed)
 Nurse Visit Vitamin B12 Injection

## 2024-06-09 DIAGNOSIS — L820 Inflamed seborrheic keratosis: Secondary | ICD-10-CM | POA: Diagnosis not present

## 2024-06-09 DIAGNOSIS — L814 Other melanin hyperpigmentation: Secondary | ICD-10-CM | POA: Diagnosis not present

## 2024-06-09 DIAGNOSIS — Z0100 Encounter for examination of eyes and vision without abnormal findings: Secondary | ICD-10-CM | POA: Diagnosis not present

## 2024-06-09 DIAGNOSIS — L578 Other skin changes due to chronic exposure to nonionizing radiation: Secondary | ICD-10-CM | POA: Diagnosis not present

## 2024-06-18 DIAGNOSIS — F251 Schizoaffective disorder, depressive type: Secondary | ICD-10-CM | POA: Diagnosis not present

## 2024-06-30 ENCOUNTER — Ambulatory Visit: Payer: Medicare HMO | Admitting: Internal Medicine

## 2024-06-30 ENCOUNTER — Encounter: Payer: Self-pay | Admitting: Internal Medicine

## 2024-06-30 VITALS — BP 110/60 | HR 63 | Temp 97.8°F | Resp 18 | Ht 61.0 in | Wt 130.0 lb

## 2024-06-30 DIAGNOSIS — E785 Hyperlipidemia, unspecified: Secondary | ICD-10-CM | POA: Diagnosis not present

## 2024-06-30 DIAGNOSIS — K5904 Chronic idiopathic constipation: Secondary | ICD-10-CM

## 2024-06-30 DIAGNOSIS — Z860100 Personal history of colon polyps, unspecified: Secondary | ICD-10-CM

## 2024-06-30 DIAGNOSIS — E538 Deficiency of other specified B group vitamins: Secondary | ICD-10-CM | POA: Insufficient documentation

## 2024-06-30 DIAGNOSIS — E119 Type 2 diabetes mellitus without complications: Secondary | ICD-10-CM | POA: Diagnosis not present

## 2024-06-30 DIAGNOSIS — F411 Generalized anxiety disorder: Secondary | ICD-10-CM | POA: Diagnosis not present

## 2024-06-30 DIAGNOSIS — Z0000 Encounter for general adult medical examination without abnormal findings: Secondary | ICD-10-CM | POA: Diagnosis not present

## 2024-06-30 DIAGNOSIS — F209 Schizophrenia, unspecified: Secondary | ICD-10-CM | POA: Diagnosis not present

## 2024-06-30 DIAGNOSIS — F330 Major depressive disorder, recurrent, mild: Secondary | ICD-10-CM | POA: Diagnosis not present

## 2024-06-30 DIAGNOSIS — Z6824 Body mass index (BMI) 24.0-24.9, adult: Secondary | ICD-10-CM | POA: Diagnosis not present

## 2024-06-30 DIAGNOSIS — K219 Gastro-esophageal reflux disease without esophagitis: Secondary | ICD-10-CM | POA: Diagnosis not present

## 2024-06-30 NOTE — Assessment & Plan Note (Signed)
 We will refer her for colonoscopy at this time.

## 2024-06-30 NOTE — Assessment & Plan Note (Signed)
 She is followed by [facility] and states she has no problems today.  They have changed some of her meds around over the past year.  She continues to see the psychiatrist every 3 months.

## 2024-06-30 NOTE — Assessment & Plan Note (Signed)
 Health maintenance discussed.  We will arrange for a bone density, mammogram and colonoscopy.  We will obtain some yearly labs.

## 2024-06-30 NOTE — Assessment & Plan Note (Signed)
 Plan as above.

## 2024-06-30 NOTE — Progress Notes (Signed)
 Office Visit  Subjective   Patient ID: Kayla Guzman   DOB: Jul 03, 1961   Age: 63 y.o.   MRN: [id number]   Chief Complaint Chief Complaint  Patient presents with   Annual Exam    CPE     History of Present Illness Kayla Guzman is a 63 year old Caucasian/White female who presents for her annual health maintenance exam. She is due for the following health maintenance studies: visual exam, colonoscopy, and screening labs. This patient's past medical history Anxiety Disorder, Depression, Diabetes Mellitus, Type II, GERD, Hypercholesterolemia, and Schizophrenia.   Her last diabetic dilated eye exam was done by Bettye Hull on 05/20/2024 which showed no evidence of diabetic retinopathy.  They are following her for bilateral cataracts. Her last colonoscopy was on 02/22/2022 and this showed diverticulosis but was otherwise an inadequate prep and they want her to repeat this in 3 months.  She states she never went in to repeat this colonoscopy  She has a history of chronic constipation where she currently does not take any supplements.   She does have a history of GERD which is controlled on omeprazole . Her last digital screening mammogram was done on 07/14/2021 and this was normal.  She has been referred for subsequent mammograms but she never went.  The patient is exercising by walking 1 hours every day and using the stair master.  She does not get yearly flu vaccines. She did have a pneumovax 23 vaccine per the patient in 2016. She has not had any of the shingles vaccines. She has not had any of the COVID-19 vaccines. Her brother had CAD and underwent CABG.  She underwent a coronary CT angiogram on 05/12/2021 where her coronaries were clear and her calcium  scoring was 0.  She is not on an ASA.   The patient is a 63 year old Caucasian/White female who returns for a follow-up visit for her T2 diabetes. She was diagnosed with T2 diabetes in 2015. Since the last visit, there have been no problems.  She did try  starting on a treadmill for exercising but she did not continue this regularly.  She remains on Januvia  100 mg daily and metformin  1000mg  BID. She specifically denies unexplained abdominal pain, nausea or vomiting, or documented hypoglycemia. She states checks her blood sugars maybe once a week and this ranges 100-130.  She came in fasting today in anticipation of lab work. Her last HgbA1c was done 8 months ago and was 5.6%. She denies any long term complications of diabetic neuropathy, nephropathy, retinopathy or cardiovascular disease.  Her last diabetic dilated eye exam was done by Bettye Hull on 05/20/2024 which showed no evidence of diabetic retinopathy.  [name] returns today for routine followup on her cholesterol. She was diagnosed with HCL in 2015. Overall, she states she is doing well and is without any complaints or problems at this time. She specifically denies abdominal pain, nausea, vomiting, diarrhea, myalgias, and fatigue. She remains on dietary management as well as the following cholesterol lowering medications rosuvastatin  40 mg oral tablet daily. She has already eaten this morning.  The patient also was told he had a Vit B12 defiency on blood work years ago.  She has been on vitamin B12 and was started on Vitamin B12 injections which she gets monthly.  The patient returns today for her schizophrenia and depression/anxiety where she is followed by [facility] where she sees them every 3 months.  She states that since she was diagnosed with schizophrenia where she  hears one voice that usually talks to her daily.   These voices talk down to her but do not tell her to hurt herself or others.  She has no visual hallucinations.   She does see the psychiatrist who writes her meds but she does not see counselling at this time. She was diagnosed with schizophrenia and depression/anxiety at the age of 63. Today, she is no longer on risperdone but they changed her to Vraylar.  She is on Lexapro  10mg   daily and congentin prn. She reports no additional symptoms. She denies difficulty concentrating, difficulty performing routine daily activities, fatigue, extreme feelings of guilt, feelings of isolation, feelings of worthlessness, helpless feeling, weight loss, insomnia, loss of appetite, social withdrawal, loss of interest in pleasurable activities, out of control feelings, and panic attacks. She currently lives with her daughter. She has been admitted multiple to a psychiatric facility and her last admission was about 8-9 years ago.       Past Medical History Past Medical History:  Diagnosis Date   Anxiety    Depression    Diabetes mellitus, type 2 (HCC)    GERD (gastroesophageal reflux disease)    Hypercholesteremia    Schizophrenia (HCC)      Allergies No Active Allergies   Medications  Current Outpatient Medications:    Accu-Chek FastClix Lancets MISC, Use to check FSBS 3 times a day, Disp: 100 each, Rfl: 2   benztropine  (COGENTIN ) 1 MG tablet, Take 1 tablet (1 mg total) by mouth 2 (two) times daily as needed., Disp: 90 tablet, Rfl: 1   Blood Glucose Monitoring Suppl (ACCU-CHEK AVIVA PLUS) w/Device KIT, 1 each by Does not apply route 3 (three) times daily., Disp: 1 kit, Rfl: 0   escitalopram  (LEXAPRO ) 10 MG tablet, Take 1 tablet (10 mg total) by mouth daily., Disp: 90 tablet, Rfl: 1   glucose blood (ACCU-CHEK AVIVA PLUS) test strip, Use as instructed, Disp: 100 each, Rfl: 12   glucose blood (ONETOUCH VERIO) test strip, Use as instructed, Disp: 180 strip, Rfl: 1   Lancets (ONETOUCH DELICA PLUS LANCET33G) MISC, Check blood sugar with lancets twice daily, Disp: 100 each, Rfl: 3   metFORMIN  (GLUCOPHAGE ) 1000 MG tablet, Take 1 tablet (1,000 mg total) by mouth 2 (two) times daily with a meal., Disp: 180 tablet, Rfl: 3   omeprazole  (PRILOSEC) 20 MG capsule, Take 1 capsule (20 mg total) by mouth every other day., Disp: 90 capsule, Rfl: 1   rosuvastatin  (CRESTOR ) 40 MG tablet, Take 1  tablet (40 mg total) by mouth daily., Disp: 90 tablet, Rfl: 0   sitaGLIPtin  (JANUVIA ) 100 MG tablet, Take 1 tablet (100 mg total) by mouth daily., Disp: 90 tablet, Rfl: 1   Review of Systems Review of Systems  Constitutional:  Negative for chills, fever and malaise/fatigue.  Eyes:  Negative for blurred vision and double vision.  Respiratory:  Negative for cough, hemoptysis, shortness of breath and wheezing.   Cardiovascular:  Negative for chest pain, palpitations and leg swelling.  Gastrointestinal:  Negative for abdominal pain, blood in stool, constipation, diarrhea, heartburn, melena, nausea and vomiting.  Genitourinary:  Negative for frequency and hematuria.  Musculoskeletal:  Negative for myalgias.  Skin:  Negative for itching and rash.  Neurological:  Negative for dizziness, weakness and headaches.  Endo/Heme/Allergies:  Negative for polydipsia.  Psychiatric/Behavioral:  Negative for depression, substance abuse and suicidal ideas.        Objective:    Vitals BP 110/60   Pulse 63   Temp  97.8 F (36.6 C) (Oral)   Resp 18   Ht 5' 1 (1.549 m)   Wt 130 lb (59 kg)   SpO2 98%   BMI 24.56 kg/m    Physical Examination Physical Exam Constitutional:      Appearance: Normal appearance. She is not ill-appearing.  HENT:     Head: Normocephalic and atraumatic.     Right Ear: Tympanic membrane, ear canal and external ear normal.     Left Ear: Tympanic membrane, ear canal and external ear normal.     Nose: Nose normal. No congestion or rhinorrhea.     Mouth/Throat:     Mouth: Mucous membranes are moist.     Pharynx: Oropharynx is clear. No oropharyngeal exudate or posterior oropharyngeal erythema.  Eyes:     General: No scleral icterus.    Conjunctiva/sclera: Conjunctivae normal.     Pupils: Pupils are equal, round, and reactive to light.  Neck:     Vascular: No carotid bruit.  Cardiovascular:     Rate and Rhythm: Normal rate and regular rhythm.     Pulses: Normal pulses.      Heart sounds: No murmur heard.    No friction rub. No gallop.  Pulmonary:     Effort: Pulmonary effort is normal. No respiratory distress.     Breath sounds: No wheezing, rhonchi or rales.  Abdominal:     General: Bowel sounds are normal. There is no distension.     Palpations: Abdomen is soft.     Tenderness: There is no abdominal tenderness.  Musculoskeletal:     Cervical back: Neck supple. No tenderness.     Right lower leg: No edema.     Left lower leg: No edema.  Lymphadenopathy:     Cervical: No cervical adenopathy.  Skin:    General: Skin is warm and dry.     Findings: No rash.  Neurological:     General: No focal deficit present.     Mental Status: She is alert and oriented to person, place, and time.  Psychiatric:        Mood and Affect: Mood normal.        Behavior: Behavior normal.        Assessment & Plan:   GERD (gastroesophageal reflux disease) Her reflux is controlled on omeprazole .  Chronic idiopathic constipation She states walking helps with her constipation.  She is not on any medications at this time.  T2 Diabetes mellitus without complication (HCC) Her diabetic foot exam was normal today.  We will check her diabetic labs and urine studies.  Vitamin B 12 deficiency She remains on supplmentation.  We will check her Vitamin B12 levels today.  Schizophrenia (HCC) She is followed by [facility] and states she has no problems today.  They have changed some of her meds around over the past year.  She continues to see the psychiatrist every 3 months.  Major depressive disorder, recurrent episode, mild (HCC) She has mild recurrent major depression that is mild and controlled.    Hyperlipidemia She remains on crestor .  We will check her FLP today.  History of colonic polyps We will refer her for colonoscopy at this time.  GAD (generalized anxiety disorder) Plan as above.  BMI 24.0-24.9, adult Continue on regular exercise and continue to eat  healthy.  Annual physical exam Health maintenance discussed.  We will arrange for a bone density, mammogram and colonoscopy.  We will obtain some yearly labs.    Return in about 3  months (around 09/29/2024).   Selinda Fleeta Finger, MD

## 2024-06-30 NOTE — Assessment & Plan Note (Signed)
 She states walking helps with her constipation.  She is not on any medications at this time.

## 2024-06-30 NOTE — Assessment & Plan Note (Signed)
 Her diabetic foot exam was normal today.  We will check her diabetic labs and urine studies.

## 2024-06-30 NOTE — Assessment & Plan Note (Signed)
 She remains on crestor .  We will check her FLP today.

## 2024-06-30 NOTE — Assessment & Plan Note (Signed)
Her reflux is controlled on omeprazole.

## 2024-06-30 NOTE — Assessment & Plan Note (Signed)
 She remains on supplmentation.  We will check her Vitamin B12 levels today.

## 2024-06-30 NOTE — Assessment & Plan Note (Signed)
 Continue on regular exercise and continue to eat healthy.

## 2024-06-30 NOTE — Assessment & Plan Note (Signed)
 She has mild recurrent major depression that is mild and controlled.

## 2024-07-01 LAB — CMP14 + ANION GAP
ALT: 16 IU/L (ref 0–32)
AST: 25 IU/L (ref 0–40)
Albumin: 4.4 g/dL (ref 3.9–4.9)
Alkaline Phosphatase: 85 IU/L (ref 49–135)
Anion Gap: 16 mmol/L (ref 10.0–18.0)
BUN/Creatinine Ratio: 17 (ref 12–28)
BUN: 12 mg/dL (ref 8–27)
Bilirubin Total: 0.3 mg/dL (ref 0.0–1.2)
CO2: 23 mmol/L (ref 20–29)
Calcium: 8.9 mg/dL (ref 8.7–10.3)
Chloride: 100 mmol/L (ref 96–106)
Creatinine, Ser: 0.72 mg/dL (ref 0.57–1.00)
Globulin, Total: 2.7 g/dL (ref 1.5–4.5)
Glucose: 80 mg/dL (ref 70–99)
Potassium: 3.8 mmol/L (ref 3.5–5.2)
Sodium: 139 mmol/L (ref 134–144)
Total Protein: 7.1 g/dL (ref 6.0–8.5)
eGFR: 94 mL/min/1.73 (ref >59)

## 2024-07-01 LAB — CBC WITH DIFFERENTIAL/PLATELET
Basophils Absolute: 0 x10E3/uL (ref 0.0–0.2)
Basos: 0 %
EOS (ABSOLUTE): 0 x10E3/uL (ref 0.0–0.4)
Eos: 1 %
Hematocrit: 36.7 % (ref 34.0–46.6)
Hemoglobin: 11.5 g/dL (ref 11.1–15.9)
Immature Grans (Abs): 0 x10E3/uL (ref 0.0–0.1)
Immature Granulocytes: 0 %
Lymphocytes Absolute: 1 x10E3/uL (ref 0.7–3.1)
Lymphs: 31 %
MCH: 26.3 pg — ABNORMAL LOW (ref 26.6–33.0)
MCHC: 31.3 g/dL — ABNORMAL LOW (ref 31.5–35.7)
MCV: 84 fL (ref 79–97)
Monocytes Absolute: 0.3 x10E3/uL (ref 0.1–0.9)
Monocytes: 9 %
Neutrophils Absolute: 2 x10E3/uL (ref 1.4–7.0)
Neutrophils: 59 %
Platelets: 181 x10E3/uL (ref 150–450)
RBC: 4.37 x10E6/uL (ref 3.77–5.28)
RDW: 14 % (ref 11.7–15.4)
WBC: 3.4 x10E3/uL (ref 3.4–10.8)

## 2024-07-01 LAB — LIPID PANEL
Chol/HDL Ratio: 2.2 ratio (ref 0.0–4.4)
Cholesterol, Total: 123 mg/dL (ref 100–199)
HDL: 57 mg/dL (ref >39)
LDL Chol Calc (NIH): 54 mg/dL (ref 0–99)
Triglycerides: 53 mg/dL (ref 0–149)
VLDL Cholesterol Cal: 12 mg/dL (ref 5–40)

## 2024-07-01 LAB — VITAMIN B12: Vitamin B-12: 446 pg/mL (ref 232–1245)

## 2024-07-01 LAB — HEMOGLOBIN A1C
Est. average glucose Bld gHb Est-mCnc: 128 mg/dL
Hgb A1c MFr Bld: 6.1 % — ABNORMAL HIGH (ref 4.8–5.6)

## 2024-07-01 LAB — MICROALBUMIN / CREATININE URINE RATIO
Creatinine, Urine: 43.4 mg/dL
Microalb/Creat Ratio: 55 mg/g{creat} — ABNORMAL HIGH (ref 0–29)
Microalbumin, Urine: 23.8 ug/mL

## 2024-07-01 LAB — TSH: TSH: 1.03 u[IU]/mL (ref 0.450–4.500)

## 2024-07-08 ENCOUNTER — Ambulatory Visit: Admitting: Internal Medicine

## 2024-07-08 DIAGNOSIS — E538 Deficiency of other specified B group vitamins: Secondary | ICD-10-CM | POA: Diagnosis not present

## 2024-07-08 MED ORDER — CYANOCOBALAMIN 1000 MCG/ML IJ SOLN
1000.0000 ug | Freq: Once | INTRAMUSCULAR | Status: AC
  Administered 2024-07-08: 1000 ug via INTRAMUSCULAR

## 2024-07-08 NOTE — Progress Notes (Signed)
 Nurse Visit Vitamin B12 Injection, Right Arm

## 2024-07-09 ENCOUNTER — Other Ambulatory Visit: Payer: Self-pay | Admitting: Internal Medicine

## 2024-07-22 DIAGNOSIS — F251 Schizoaffective disorder, depressive type: Secondary | ICD-10-CM | POA: Diagnosis not present

## 2024-07-31 ENCOUNTER — Ambulatory Visit: Payer: Self-pay

## 2024-07-31 NOTE — Progress Notes (Signed)
 Patient called.  Patient aware.  Mailed labs to patient.

## 2024-08-01 ENCOUNTER — Other Ambulatory Visit: Payer: Self-pay | Admitting: Internal Medicine

## 2024-08-11 DIAGNOSIS — F251 Schizoaffective disorder, depressive type: Secondary | ICD-10-CM | POA: Diagnosis not present

## 2024-08-12 ENCOUNTER — Ambulatory Visit: Admitting: Internal Medicine

## 2024-08-12 DIAGNOSIS — E538 Deficiency of other specified B group vitamins: Secondary | ICD-10-CM | POA: Diagnosis not present

## 2024-08-12 MED ORDER — CYANOCOBALAMIN 1000 MCG/ML IJ SOLN
1000.0000 ug | Freq: Once | INTRAMUSCULAR | Status: AC
  Administered 2024-08-12: 1000 ug via INTRAMUSCULAR

## 2024-08-12 NOTE — Progress Notes (Signed)
 Nurse Visit B12 Injection Right Deltoid

## 2024-08-31 ENCOUNTER — Other Ambulatory Visit: Payer: Self-pay | Admitting: Internal Medicine

## 2024-08-31 DIAGNOSIS — E119 Type 2 diabetes mellitus without complications: Secondary | ICD-10-CM

## 2024-09-16 ENCOUNTER — Ambulatory Visit

## 2024-09-17 ENCOUNTER — Ambulatory Visit

## 2024-09-29 ENCOUNTER — Ambulatory Visit: Admitting: Internal Medicine

## 2024-10-13 ENCOUNTER — Encounter: Payer: Self-pay | Admitting: Internal Medicine

## 2024-10-13 ENCOUNTER — Ambulatory Visit: Admitting: Internal Medicine

## 2024-10-13 VITALS — BP 124/78 | HR 66 | Temp 97.6°F | Resp 18 | Ht 61.0 in | Wt 138.1 lb

## 2024-10-13 DIAGNOSIS — E785 Hyperlipidemia, unspecified: Secondary | ICD-10-CM

## 2024-10-13 DIAGNOSIS — F200 Paranoid schizophrenia: Secondary | ICD-10-CM

## 2024-10-13 DIAGNOSIS — E1169 Type 2 diabetes mellitus with other specified complication: Secondary | ICD-10-CM | POA: Insufficient documentation

## 2024-10-13 DIAGNOSIS — E538 Deficiency of other specified B group vitamins: Secondary | ICD-10-CM

## 2024-10-13 DIAGNOSIS — F411 Generalized anxiety disorder: Secondary | ICD-10-CM

## 2024-10-13 NOTE — Assessment & Plan Note (Signed)
 Her anxiety is still not well-controlled.  She follows with [facility].

## 2024-10-13 NOTE — Assessment & Plan Note (Signed)
 Her diabetes is well-controlled with hemoglobin A1c was 6.1% and her lipid panel is also well-controlled on the labs drawn in September 2025.  She has already seen eye doctor for diabetic eye exam.

## 2024-10-13 NOTE — Assessment & Plan Note (Signed)
 She will continue to take vitamin B12 replacement.

## 2024-10-13 NOTE — Progress Notes (Signed)
" ° °  Office Visit  Subjective   Patient ID: Kayla Guzman   DOB: May 31, 1961   Age: 63 y.o.   MRN: [id number]   Chief Complaint Chief Complaint  Patient presents with   Follow-up    3 month     History of Present Illness  63 years old female is here for follow up.  She has type 2 diabetes mellitus and her sugar is well-controlled.  Her hemoglobin A1c was 6.1 on June 30, 2024.  She has diabetic eye exam done by [facility] on May 20, 2024 that showed no evidence of diabetic retinopathy.  She takes Januvia  100 mg and metformin  1000 mg twice a day.  She denies any side effects.  63 years old female who has hyperlipidemia.  She denies any side effect from medication and her lipid panel done in September shows LDL was target controlled.  She also has B12 deficiency and she is taking B12 replacement.  She has a history of schizophrenia with anxiety and depression and she follows by [facility].  She says that her anxiety is still not controlled but her depression is better.   Past Medical History Past Medical History:  Diagnosis Date   Anxiety    Depression    Diabetes mellitus, type 2 (HCC)    GERD (gastroesophageal reflux disease)    Hypercholesteremia    Schizophrenia (HCC)      Allergies Allergies[1]   Review of Systems Review of Systems  Constitutional: Negative.   HENT: Negative.    Respiratory: Negative.    Cardiovascular: Negative.   Gastrointestinal: Negative.   Neurological: Negative.        Objective:    Vitals BP 124/78   Pulse 66   Temp 97.6 F (36.4 C)   Resp 18   Ht 5' 1 (1.549 m)   Wt 138 lb 2 oz (62.7 kg)   SpO2 99%   BMI 26.10 kg/m    Physical Examination Physical Exam Constitutional:      Appearance: Normal appearance. She is obese.  HENT:     Head: Normocephalic and atraumatic.  Cardiovascular:     Rate and Rhythm: Normal rate and regular rhythm.     Heart sounds: Normal heart sounds.  Pulmonary:     Effort: Pulmonary effort is  normal.     Breath sounds: Normal breath sounds.  Abdominal:     General: Bowel sounds are normal.     Palpations: Abdomen is soft.  Neurological:     General: No focal deficit present.     Mental Status: She is alert and oriented to person, place, and time.        Assessment & Plan:   Dyslipidemia associated with type 2 diabetes mellitus (HCC) Her diabetes is well-controlled with hemoglobin A1c was 6.1% and her lipid panel is also well-controlled on the labs drawn in September 2025.  She has already seen eye doctor for diabetic eye exam.  Vitamin B 12 deficiency She will continue to take vitamin B12 replacement.  GAD (generalized anxiety disorder) Her anxiety is still not well-controlled.  She follows with [facility].    Return in about 3 months (around 01/11/2025).   Roetta Dare, MD      [1] No Active Allergies  "

## 2024-10-21 ENCOUNTER — Ambulatory Visit

## 2024-10-27 ENCOUNTER — Ambulatory Visit: Admitting: Internal Medicine

## 2024-10-27 DIAGNOSIS — E538 Deficiency of other specified B group vitamins: Secondary | ICD-10-CM

## 2024-10-27 MED ORDER — CYANOCOBALAMIN 1000 MCG/ML IJ SOLN
1000.0000 ug | Freq: Once | INTRAMUSCULAR | Status: AC
  Administered 2024-10-27: 1000 ug via INTRAMUSCULAR

## 2024-10-27 NOTE — Progress Notes (Signed)
 Nurse Visit B12 injection

## 2024-10-30 ENCOUNTER — Other Ambulatory Visit: Payer: Self-pay

## 2024-10-30 DIAGNOSIS — E119 Type 2 diabetes mellitus without complications: Secondary | ICD-10-CM

## 2024-10-30 MED ORDER — SITAGLIPTIN PHOSPHATE 100 MG PO TABS: 100.0000 mg | ORAL_TABLET | Freq: Every day | ORAL | 1 refills | Status: AC

## 2024-12-02 ENCOUNTER — Ambulatory Visit

## 2025-01-09 ENCOUNTER — Ambulatory Visit: Admitting: Internal Medicine
